# Patient Record
Sex: Female | Born: 1965
Health system: Southern US, Community
[De-identification: ages and names within clinical notes are randomized; demographics above are authoritative.]

## PROBLEM LIST (undated history)

## (undated) ENCOUNTER — Emergency Department (HOSPITAL_BASED_OUTPATIENT_CLINIC_OR_DEPARTMENT_OTHER): Admission: EM | Payer: BC Managed Care – PPO

## (undated) DIAGNOSIS — M2550 Pain in unspecified joint: Secondary | ICD-10-CM

## (undated) DIAGNOSIS — L309 Dermatitis, unspecified: Secondary | ICD-10-CM

## (undated) DIAGNOSIS — R0602 Shortness of breath: Secondary | ICD-10-CM

## (undated) DIAGNOSIS — R12 Heartburn: Secondary | ICD-10-CM

## (undated) DIAGNOSIS — E7800 Pure hypercholesterolemia, unspecified: Secondary | ICD-10-CM

## (undated) DIAGNOSIS — R7303 Prediabetes: Secondary | ICD-10-CM

## (undated) DIAGNOSIS — K760 Fatty (change of) liver, not elsewhere classified: Secondary | ICD-10-CM

## (undated) DIAGNOSIS — E039 Hypothyroidism, unspecified: Secondary | ICD-10-CM

## (undated) DIAGNOSIS — E559 Vitamin D deficiency, unspecified: Secondary | ICD-10-CM

## (undated) DIAGNOSIS — I10 Essential (primary) hypertension: Secondary | ICD-10-CM

## (undated) HISTORY — DX: Pure hypercholesterolemia, unspecified: E78.00

## (undated) HISTORY — DX: Vitamin D deficiency, unspecified: E55.9

## (undated) HISTORY — DX: Prediabetes: R73.03

## (undated) HISTORY — PX: TONSILLECTOMY: SUR1361

## (undated) HISTORY — DX: Heartburn: R12

## (undated) HISTORY — DX: Pain in unspecified joint: M25.50

## (undated) HISTORY — DX: Hypothyroidism, unspecified: E03.9

## (undated) HISTORY — PX: TEAR DUCT PROBING: SHX793

## (undated) HISTORY — DX: Fatty (change of) liver, not elsewhere classified: K76.0

## (undated) HISTORY — DX: Shortness of breath: R06.02

---

## 1999-04-17 ENCOUNTER — Other Ambulatory Visit: Admission: RE | Admit: 1999-04-17 | Discharge: 1999-04-17 | Payer: Self-pay | Admitting: Obstetrics & Gynecology

## 2001-12-02 ENCOUNTER — Other Ambulatory Visit: Admission: RE | Admit: 2001-12-02 | Discharge: 2001-12-02 | Payer: Self-pay | Admitting: Obstetrics & Gynecology

## 2003-01-12 ENCOUNTER — Other Ambulatory Visit: Admission: RE | Admit: 2003-01-12 | Discharge: 2003-01-12 | Payer: Self-pay | Admitting: Obstetrics & Gynecology

## 2004-06-12 ENCOUNTER — Other Ambulatory Visit: Admission: RE | Admit: 2004-06-12 | Discharge: 2004-06-12 | Payer: Self-pay | Admitting: Obstetrics & Gynecology

## 2005-08-26 ENCOUNTER — Other Ambulatory Visit: Admission: RE | Admit: 2005-08-26 | Discharge: 2005-08-26 | Payer: Self-pay | Admitting: Obstetrics & Gynecology

## 2013-12-12 ENCOUNTER — Ambulatory Visit
Admission: RE | Admit: 2013-12-12 | Discharge: 2013-12-12 | Disposition: A | Discharge status: Home / Self Care | Payer: 59 | Source: Ambulatory Visit | Attending: Family Medicine | Admitting: Family Medicine

## 2013-12-12 ENCOUNTER — Other Ambulatory Visit: Payer: Self-pay | Admitting: Family Medicine

## 2013-12-12 DIAGNOSIS — R109 Unspecified abdominal pain: Secondary | ICD-10-CM

## 2013-12-12 DIAGNOSIS — K625 Hemorrhage of anus and rectum: Secondary | ICD-10-CM

## 2013-12-12 MED ORDER — IOHEXOL 300 MG/ML  SOLN
100.0000 mL | Freq: Once | INTRAMUSCULAR | Status: AC | PRN
Start: 2013-12-12 — End: 2013-12-12
  Administered 2013-12-12: 100 mL via INTRAVENOUS

## 2015-03-25 IMAGING — CT CT ABD-PELV W/ CM
3 of 5 series · 13 of 36 positions shown, 19 images · IV contrast (OMNI 300/WATER & [ID] OMNI 300)
Comparison: None.

CLINICAL DATA: Abdominal pain with rectal bleeding.

EXAM:
CT ABDOMEN AND PELVIS WITH CONTRAST
TECHNIQUE: Multidetector CT imaging of the abdomen and pelvis was performed
using the standard protocol following bolus administration of
intravenous contrast.
CONTRAST:  100mL OMNIPAQUE IOHEXOL 300 MG/ML  SOLN

[Series 3: abd/pelvis with · axial · 0.90mm/px · z∈[-348,-18]mm · 7 of 88 slices shown, 12 images]
[im 11/88  soft-tissue]
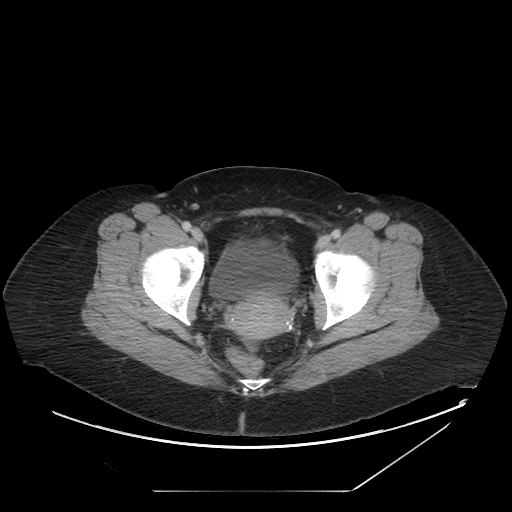
[im 11/88  bone]
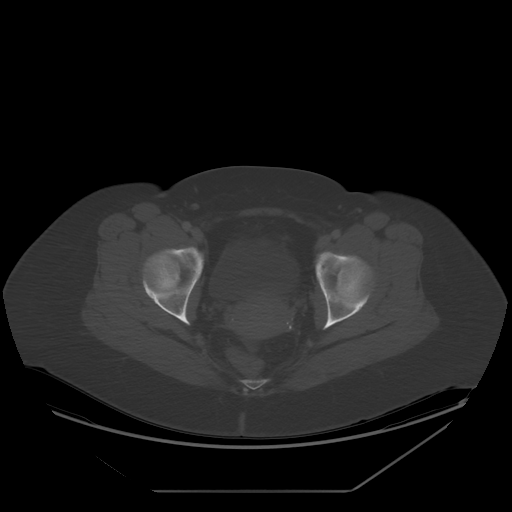
[im 22/88  soft-tissue]
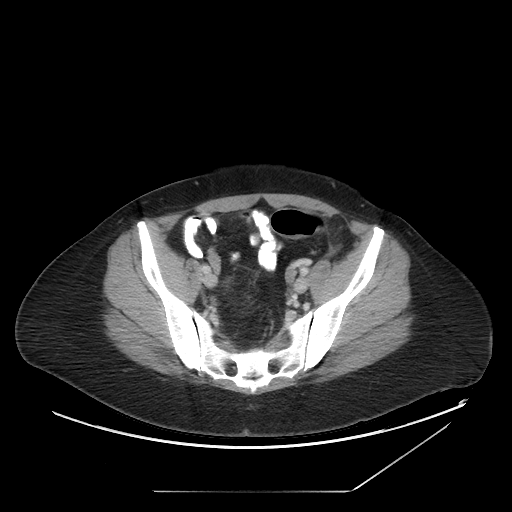
[im 33/88  soft-tissue]
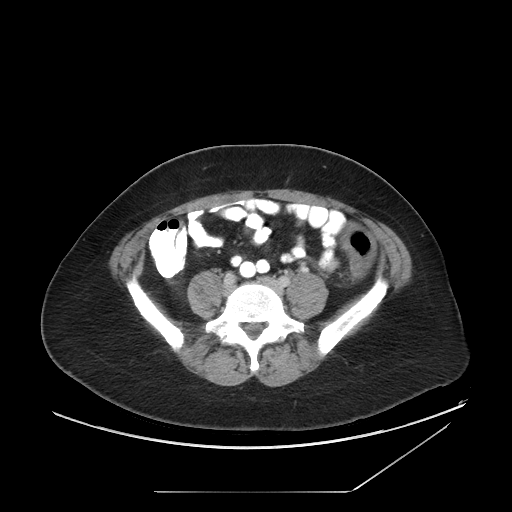
[im 44/88  soft-tissue]
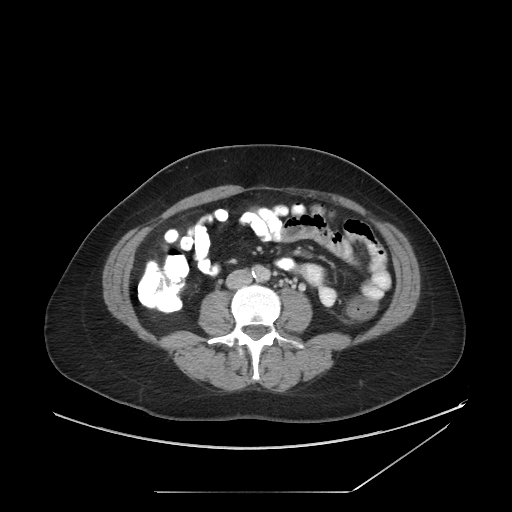
[im 44/88  lung]
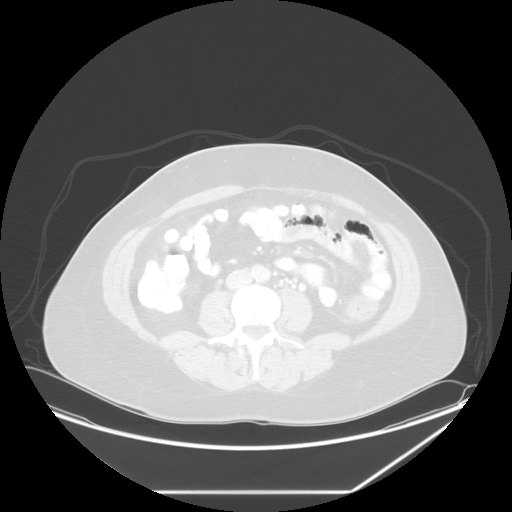
[im 55/88  soft-tissue]
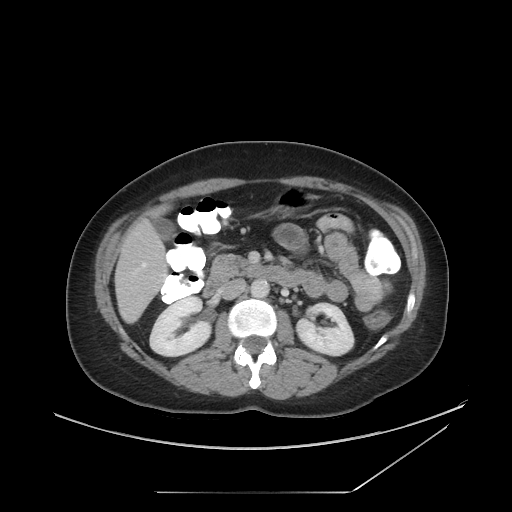
[im 55/88  lung]
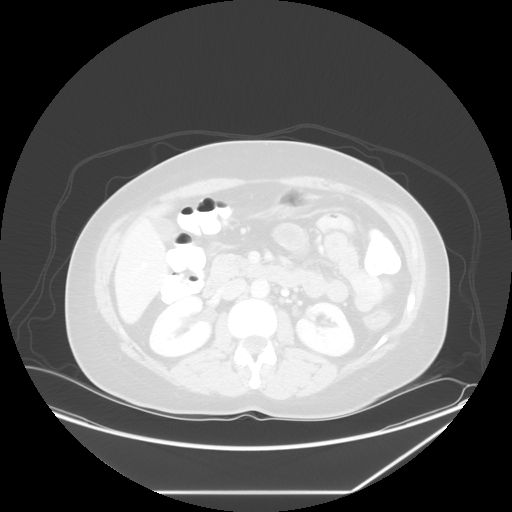
[im 66/88  soft-tissue]
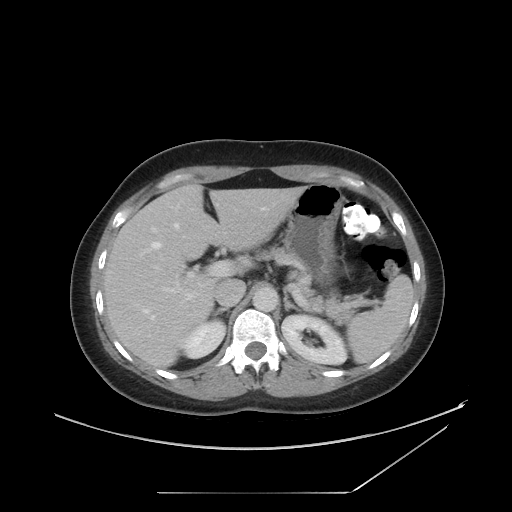
[im 66/88  lung]
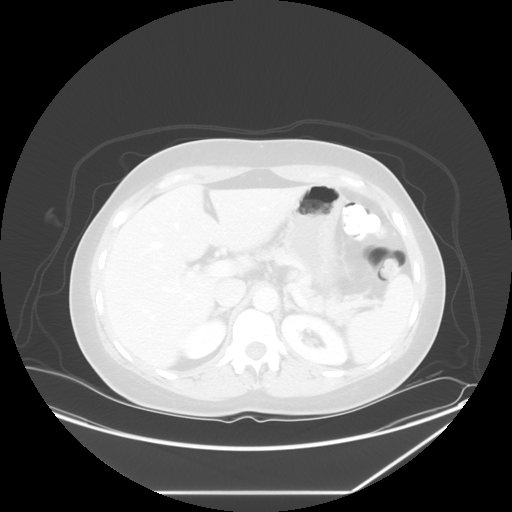
[im 77/88  soft-tissue]
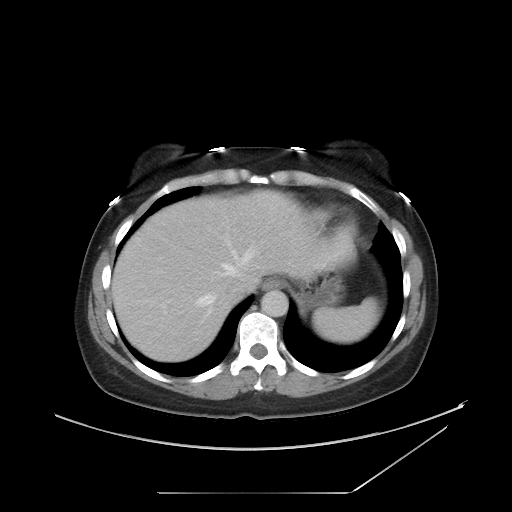
[im 77/88  lung]
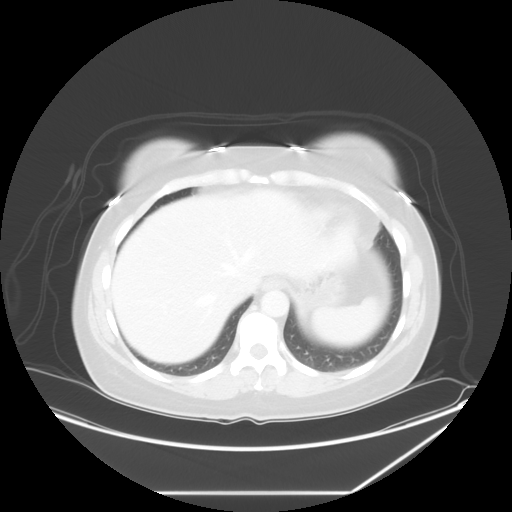

[Series 601: coronal body · coronal · 0.93mm/px · 1 of 122 slices shown, 2 images]
[im 41/122  soft-tissue]
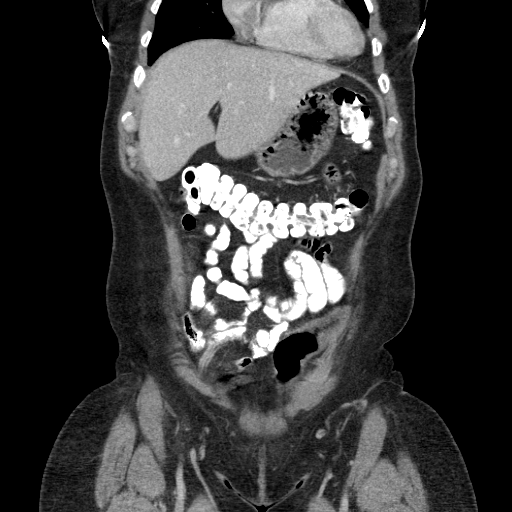
[im 41/122  bone]
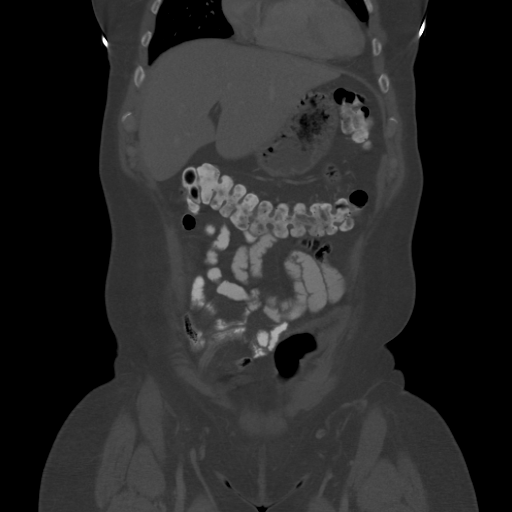

[Series 602: sagittal body · sagittal · 0.93mm/px · 5 of 184 slices shown]
[im 22/184  soft-tissue]
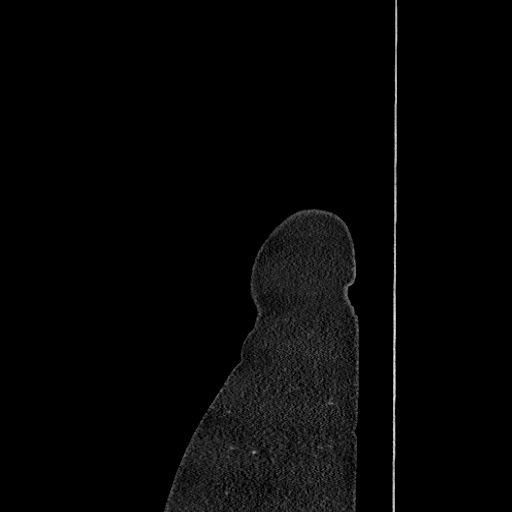
[im 44/184  soft-tissue]
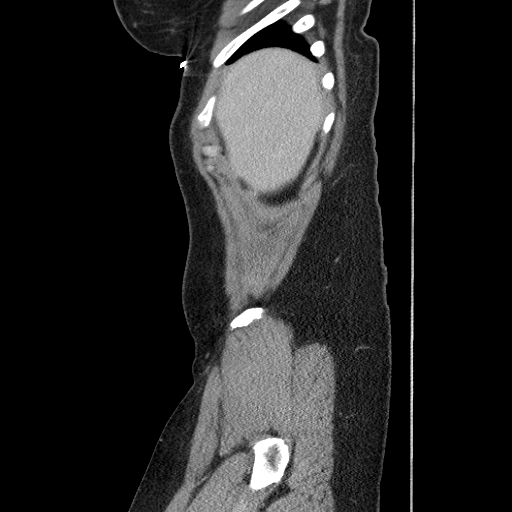
[im 65/184  soft-tissue]
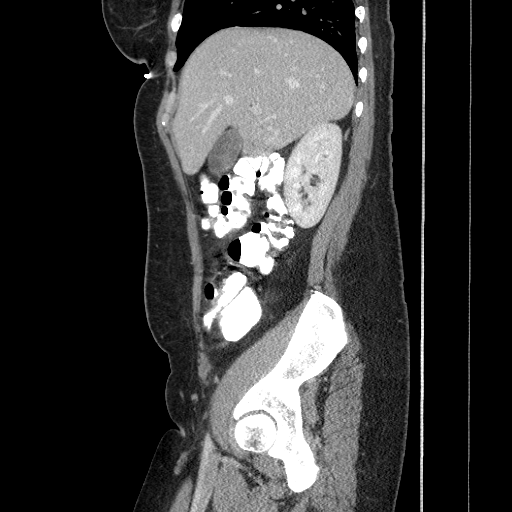
[im 87/184  soft-tissue]
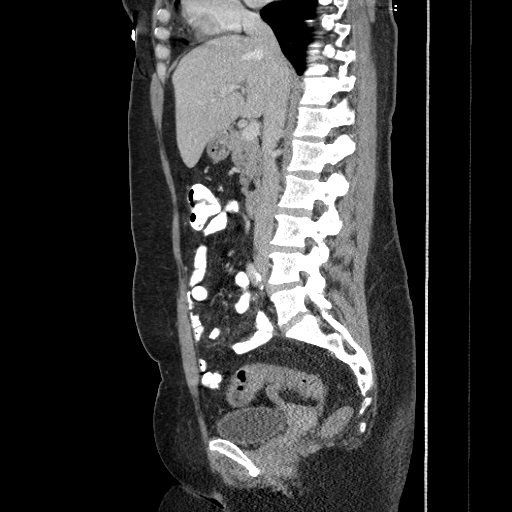
[im 108/184  soft-tissue]
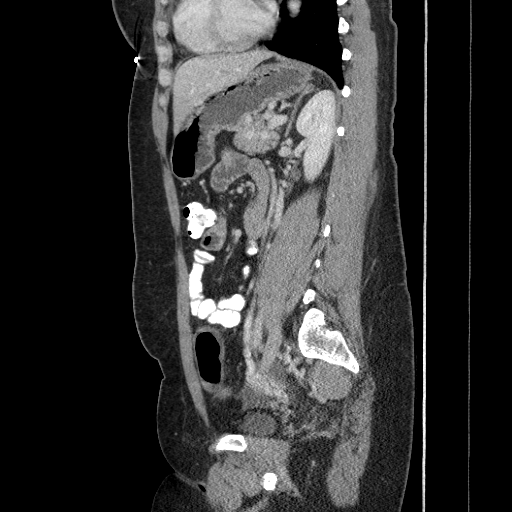

[13 of 36 positions shown; findings below may reference images not displayed]

FINDINGS: There is extensive edema of the mucosa of the descending and
proximal sigmoid portions of the colon consistent with colitis.
There is very slight pericolonic soft tissue stranding. There is no
free air in the abdomen. There is small amount of free fluid in the
pelvis.

The liver, biliary tree, spleen, pancreas, adrenal glands, and
kidneys appear normal. Small bowel and terminal ileum appear normal.
Appendix is not definitively identified.

Uterus and ovaries are normal. Bladder is normal. No osseous
abnormality.
IMPRESSION: Colitis involving the descending and proximal sigmoid portions of
the colon.

## 2017-03-03 DIAGNOSIS — M722 Plantar fascial fibromatosis: Secondary | ICD-10-CM | POA: Diagnosis not present

## 2017-03-18 DIAGNOSIS — B338 Other specified viral diseases: Secondary | ICD-10-CM | POA: Diagnosis not present

## 2017-03-31 DIAGNOSIS — M722 Plantar fascial fibromatosis: Secondary | ICD-10-CM | POA: Diagnosis not present

## 2017-04-20 DIAGNOSIS — Z1231 Encounter for screening mammogram for malignant neoplasm of breast: Secondary | ICD-10-CM | POA: Diagnosis not present

## 2017-04-20 DIAGNOSIS — Z01419 Encounter for gynecological examination (general) (routine) without abnormal findings: Secondary | ICD-10-CM | POA: Diagnosis not present

## 2017-04-27 DIAGNOSIS — I10 Essential (primary) hypertension: Secondary | ICD-10-CM | POA: Diagnosis not present

## 2017-04-27 DIAGNOSIS — Z0000 Encounter for general adult medical examination without abnormal findings: Secondary | ICD-10-CM | POA: Diagnosis not present

## 2017-04-29 DIAGNOSIS — R310 Gross hematuria: Secondary | ICD-10-CM | POA: Diagnosis not present

## 2017-05-14 ENCOUNTER — Ambulatory Visit (INDEPENDENT_AMBULATORY_CARE_PROVIDER_SITE_OTHER): Payer: 59 | Admitting: Podiatry

## 2017-05-14 ENCOUNTER — Encounter: Payer: Self-pay | Admitting: Podiatry

## 2017-05-14 ENCOUNTER — Ambulatory Visit: Payer: 59

## 2017-05-14 VITALS — BP 155/93 | HR 82

## 2017-05-14 DIAGNOSIS — M79672 Pain in left foot: Secondary | ICD-10-CM

## 2017-05-14 DIAGNOSIS — M722 Plantar fascial fibromatosis: Secondary | ICD-10-CM | POA: Diagnosis not present

## 2017-05-14 MED ORDER — MELOXICAM 15 MG PO TABS
15.0000 mg | ORAL_TABLET | Freq: Every day | ORAL | 2 refills | Status: AC
Start: 2017-05-14 — End: 2018-05-14

## 2017-05-14 NOTE — Progress Notes (Signed)
Subjective:    Patient ID: Tracy Reilly, female    DOB: 05-11-1966, 51 y.o.   MRN: [id number]  HPI Chief Complaint  Patient presents with  . Plantar Fasciitis    Lt foot medial side/bottom/back of the heel pain 6 months   51 year old female presents the office today for concerns of left heel pain which has been ongoing for about 6 months. She previously saw orthopedics for this issue steroid injection should help for short nighttime foot pain came back. She is also been stretching which does help some. She's has had a history of plantar fasciitis 15 years ago and that she had a heel cup that she used then she's been using that as well. She states the pain has gotten somewhat better over the last couple of months however does continue. She denies any recent injury or trauma. She denies any swelling or redness. No numbness or tingling. The pain does not wake her up at night. She states that she gets pain in the morning she first gets up of she's been sitting for some time and stands backup she starts to get pain. She does walk the pain is decreased. The pain started become more consistent however. She has no other concerns today.   Review of Systems  All other systems reviewed and are negative.      Objective:   Physical Exam General: AAO x3, NAD  Dermatological: Skin is warm, dry and supple bilateral. Nails x 10 are well manicured; remaining integument appears unremarkable at this time. There are no open sores, no preulcerative lesions, no rash or signs of infection present.  Vascular: Dorsalis Pedis artery and Posterior Tibial artery pedal pulses are 2/4 bilateral with immedate capillary fill time.There is no pain with calf compression, swelling, warmth, erythema.   Neruologic: Grossly intact via light touch bilateral. Vibratory intact via tuning fork bilateral. Protective threshold with Semmes Wienstein monofilament intact to all pedal sites bilateral. Negative tinel sign bilaterally.    Musculoskeletal: Tenderness to palpation along the plantar medial tubercle of the calcaneus at the insertion of plantar fascia on the left foot. There is no pain along the course of the plantar fascia within the arch of the foot. Plantar fascia appears to be intact. There is no pain with lateral compression of the calcaneus or pain with vibratory sensation. There is no pain along the course or insertion of the achilles tendon. No other areas of tenderness to bilateral lower extremities.Muscular strength 5/5 in all groups tested bilateral.  Gait: Unassisted, Nonantalgic.     Assessment & Plan:  51 year old female left heel pain likely result of plantar fasciitis; heel spur -Treatment options discussed including all alternatives, risks, and complications -Etiology of symptoms were discussed -I reviewed the x-rays and orthopedics that she brought in with her. There is no evidence of acute fracture that I can appreciate. There is both posterior as well as inferior calcaneal spurring present. -Patient elects to proceed with steroid injection into the left heel. Under sterile skin preparation, a total of 2.5cc of kenalog 10, 0.5% Marcaine plain, and 2% lidocaine plain were infiltrated into the symptomatic area without complication. A band-aid was applied. Patient tolerated the injection well without complication. Post-injection care with discussed with the patient. Discussed with the patient to ice the area over the next couple of days to help prevent a steroid flare.  -Plantar fascial brace was dispensed -Night splint. (aware that insurance will likely not cover both braces if they do cover them and  she is aware) -Prescribed mobic. Discussed side effects of the medication and directed to stop if any are to occur and call the office.  -Stretching, icing exercises daily -Discussed supportive shoe gear as well as continue inserts. May need a more custom insert. -RTC 3 week or sooner if needed.  Celesta Gentile, DPM

## 2017-05-14 NOTE — Patient Instructions (Signed)

## 2017-05-16 DIAGNOSIS — M722 Plantar fascial fibromatosis: Secondary | ICD-10-CM | POA: Insufficient documentation

## 2017-06-03 ENCOUNTER — Encounter: Payer: Self-pay | Admitting: Podiatry

## 2017-06-03 ENCOUNTER — Ambulatory Visit (INDEPENDENT_AMBULATORY_CARE_PROVIDER_SITE_OTHER): Payer: 59 | Admitting: Podiatry

## 2017-06-03 DIAGNOSIS — M722 Plantar fascial fibromatosis: Secondary | ICD-10-CM | POA: Diagnosis not present

## 2017-06-03 NOTE — Progress Notes (Signed)
Subjective: Tracy Reilly presents to the office today for follow-up evaluation of left heel pain. They state that they are doing better but still having some pain. They have been icing, stretching, try to wear supportive shoe as much as possible. She states the plantar fascial brace and the night splint have been helping. She continues with mobic which also helps. No other complaints at this time. No acute changes since last appointment. They deny any systemic complaints such as fevers, chills, nausea, vomiting.  Objective: General: AAO x3, NAD  Dermatological: Skin is warm, dry and supple bilateral. Nails x 10 are well manicured; remaining integument appears unremarkable at this time. There are no open sores, no preulcerative lesions, no rash or signs of infection present.  Vascular: Dorsalis Pedis artery and Posterior Tibial artery pedal pulses are 2/4 bilateral with immedate capillary fill time. Pedal hair growth present. There is no pain with calf compression, swelling, warmth, erythema.   Neruologic: Grossly intact via light touch bilateral. Vibratory intact via tuning fork bilateral. Protective threshold with Semmes Wienstein monofilament intact to all pedal sites bilateral.   Musculoskeletal: There is improved but continued tenderness palpation along the plantar medial tubercle of the calcaneus at the insertion of the plantar fascia on the left foot. There is no pain along the course of the plantar fascia within the arch of the foot. Plantar fascia appears to be intact bilaterally. There is no pain with lateral compression of the calcaneus and there is no pain with vibratory sensation. There is no pain along the course or insertion of the Achilles tendon. There are no other areas of tenderness to bilateral lower extremities. No gross boney pedal deformities bilateral. No pain, crepitus, or limitation noted with foot and ankle range of motion bilateral. Muscular strength 5/5 in all groups tested  bilateral.  Gait: Unassisted, Nonantalgic.   Assessment: Presents for follow-up evaluation for heel pain, likely plantar fasciitis   Plan: -Treatment options discussed including all alternatives, risks, and complications -Patient elects to proceed with steroid injection into the left heel. Under sterile skin preparation, a total of 2.5cc of kenalog 10, 0.5% Marcaine plain, and 2% lidocaine plain were infiltrated into the symptomatic area without complication. A band-aid was applied. Patient tolerated the injection well without complication. Post-injection care with discussed with the patient. Discussed with the patient to ice the area over the next couple of days to help prevent a steroid flare.  -Plantar fascial tapings applied -Meloxicam as needed -Ice and stretching exercises on a daily basis. -Continue supportive shoe gear. She has over-the-counter inserts and recommended continue this. -Follow-up in 3-4 weeks or sooner if any problems arise. In the meantime, encouraged to call the office with any questions, concerns, change in symptoms.   Celesta Gentile, DPM

## 2017-06-25 ENCOUNTER — Ambulatory Visit: Payer: 59 | Admitting: Podiatry

## 2017-08-24 DIAGNOSIS — Z23 Encounter for immunization: Secondary | ICD-10-CM | POA: Diagnosis not present

## 2017-10-21 DIAGNOSIS — J029 Acute pharyngitis, unspecified: Secondary | ICD-10-CM | POA: Diagnosis not present

## 2017-10-21 DIAGNOSIS — J069 Acute upper respiratory infection, unspecified: Secondary | ICD-10-CM | POA: Diagnosis not present

## 2017-11-03 DIAGNOSIS — I10 Essential (primary) hypertension: Secondary | ICD-10-CM | POA: Diagnosis not present

## 2017-11-03 DIAGNOSIS — L259 Unspecified contact dermatitis, unspecified cause: Secondary | ICD-10-CM | POA: Diagnosis not present

## 2017-11-15 DIAGNOSIS — D224 Melanocytic nevi of scalp and neck: Secondary | ICD-10-CM | POA: Diagnosis not present

## 2017-11-15 DIAGNOSIS — L309 Dermatitis, unspecified: Secondary | ICD-10-CM | POA: Diagnosis not present

## 2017-11-15 DIAGNOSIS — L573 Poikiloderma of Civatte: Secondary | ICD-10-CM | POA: Diagnosis not present

## 2018-01-06 DIAGNOSIS — L309 Dermatitis, unspecified: Secondary | ICD-10-CM | POA: Diagnosis not present

## 2018-01-06 DIAGNOSIS — L500 Allergic urticaria: Secondary | ICD-10-CM | POA: Diagnosis not present

## 2018-01-19 DIAGNOSIS — L500 Allergic urticaria: Secondary | ICD-10-CM | POA: Diagnosis not present

## 2018-03-14 DIAGNOSIS — I10 Essential (primary) hypertension: Secondary | ICD-10-CM | POA: Diagnosis not present

## 2018-04-04 DIAGNOSIS — R109 Unspecified abdominal pain: Secondary | ICD-10-CM | POA: Diagnosis not present

## 2018-04-06 DIAGNOSIS — R109 Unspecified abdominal pain: Secondary | ICD-10-CM | POA: Diagnosis not present

## 2018-04-26 DIAGNOSIS — Z6833 Body mass index (BMI) 33.0-33.9, adult: Secondary | ICD-10-CM | POA: Diagnosis not present

## 2018-04-26 DIAGNOSIS — Z01419 Encounter for gynecological examination (general) (routine) without abnormal findings: Secondary | ICD-10-CM | POA: Diagnosis not present

## 2018-05-04 DIAGNOSIS — I10 Essential (primary) hypertension: Secondary | ICD-10-CM | POA: Diagnosis not present

## 2018-05-04 DIAGNOSIS — Z0000 Encounter for general adult medical examination without abnormal findings: Secondary | ICD-10-CM | POA: Diagnosis not present

## 2018-05-18 DIAGNOSIS — H04123 Dry eye syndrome of bilateral lacrimal glands: Secondary | ICD-10-CM | POA: Diagnosis not present

## 2018-08-02 DIAGNOSIS — L309 Dermatitis, unspecified: Secondary | ICD-10-CM | POA: Diagnosis not present

## 2018-08-02 DIAGNOSIS — L920 Granuloma annulare: Secondary | ICD-10-CM | POA: Diagnosis not present

## 2018-08-02 DIAGNOSIS — L503 Dermatographic urticaria: Secondary | ICD-10-CM | POA: Diagnosis not present

## 2018-08-11 DIAGNOSIS — L309 Dermatitis, unspecified: Secondary | ICD-10-CM | POA: Diagnosis not present

## 2018-08-11 DIAGNOSIS — Z4802 Encounter for removal of sutures: Secondary | ICD-10-CM | POA: Diagnosis not present

## 2018-08-23 DIAGNOSIS — Z23 Encounter for immunization: Secondary | ICD-10-CM | POA: Diagnosis not present

## 2018-08-31 DIAGNOSIS — I10 Essential (primary) hypertension: Secondary | ICD-10-CM | POA: Diagnosis not present

## 2018-09-13 DIAGNOSIS — L308 Other specified dermatitis: Secondary | ICD-10-CM | POA: Diagnosis not present

## 2018-09-13 DIAGNOSIS — L309 Dermatitis, unspecified: Secondary | ICD-10-CM | POA: Diagnosis not present

## 2018-09-21 DIAGNOSIS — I10 Essential (primary) hypertension: Secondary | ICD-10-CM | POA: Diagnosis not present

## 2018-12-26 DIAGNOSIS — Z6833 Body mass index (BMI) 33.0-33.9, adult: Secondary | ICD-10-CM | POA: Diagnosis not present

## 2018-12-26 DIAGNOSIS — L309 Dermatitis, unspecified: Secondary | ICD-10-CM | POA: Diagnosis not present

## 2019-01-11 DIAGNOSIS — L309 Dermatitis, unspecified: Secondary | ICD-10-CM | POA: Diagnosis not present

## 2019-01-11 DIAGNOSIS — Z5181 Encounter for therapeutic drug level monitoring: Secondary | ICD-10-CM | POA: Diagnosis not present

## 2019-02-28 DIAGNOSIS — I10 Essential (primary) hypertension: Secondary | ICD-10-CM | POA: Diagnosis not present

## 2019-11-15 DIAGNOSIS — E559 Vitamin D deficiency, unspecified: Secondary | ICD-10-CM | POA: Diagnosis not present

## 2019-11-15 DIAGNOSIS — I10 Essential (primary) hypertension: Secondary | ICD-10-CM | POA: Diagnosis not present

## 2019-11-15 DIAGNOSIS — E781 Pure hyperglyceridemia: Secondary | ICD-10-CM | POA: Diagnosis not present

## 2019-11-15 DIAGNOSIS — L309 Dermatitis, unspecified: Secondary | ICD-10-CM | POA: Diagnosis not present

## 2019-11-27 DIAGNOSIS — E559 Vitamin D deficiency, unspecified: Secondary | ICD-10-CM | POA: Diagnosis not present

## 2019-11-27 DIAGNOSIS — E781 Pure hyperglyceridemia: Secondary | ICD-10-CM | POA: Diagnosis not present

## 2019-11-27 DIAGNOSIS — I10 Essential (primary) hypertension: Secondary | ICD-10-CM | POA: Diagnosis not present

## 2020-02-22 DIAGNOSIS — H524 Presbyopia: Secondary | ICD-10-CM | POA: Diagnosis not present

## 2020-02-22 DIAGNOSIS — H10413 Chronic giant papillary conjunctivitis, bilateral: Secondary | ICD-10-CM | POA: Diagnosis not present

## 2020-02-22 DIAGNOSIS — H52203 Unspecified astigmatism, bilateral: Secondary | ICD-10-CM | POA: Diagnosis not present

## 2020-02-22 DIAGNOSIS — H04123 Dry eye syndrome of bilateral lacrimal glands: Secondary | ICD-10-CM | POA: Diagnosis not present

## 2020-02-28 DIAGNOSIS — Z79899 Other long term (current) drug therapy: Secondary | ICD-10-CM | POA: Diagnosis not present

## 2020-02-28 DIAGNOSIS — L2084 Intrinsic (allergic) eczema: Secondary | ICD-10-CM | POA: Diagnosis not present

## 2020-03-11 DIAGNOSIS — M25511 Pain in right shoulder: Secondary | ICD-10-CM | POA: Diagnosis not present

## 2020-03-11 DIAGNOSIS — M25512 Pain in left shoulder: Secondary | ICD-10-CM | POA: Diagnosis not present

## 2020-03-20 DIAGNOSIS — Z20828 Contact with and (suspected) exposure to other viral communicable diseases: Secondary | ICD-10-CM | POA: Diagnosis not present

## 2020-03-26 DIAGNOSIS — J209 Acute bronchitis, unspecified: Secondary | ICD-10-CM | POA: Diagnosis not present

## 2020-04-11 DIAGNOSIS — J302 Other seasonal allergic rhinitis: Secondary | ICD-10-CM | POA: Diagnosis not present

## 2020-04-11 DIAGNOSIS — J019 Acute sinusitis, unspecified: Secondary | ICD-10-CM | POA: Diagnosis not present

## 2020-05-15 DIAGNOSIS — R0981 Nasal congestion: Secondary | ICD-10-CM | POA: Diagnosis not present

## 2020-05-15 DIAGNOSIS — R05 Cough: Secondary | ICD-10-CM | POA: Diagnosis not present

## 2020-05-15 DIAGNOSIS — R0602 Shortness of breath: Secondary | ICD-10-CM | POA: Diagnosis not present

## 2020-05-15 DIAGNOSIS — R062 Wheezing: Secondary | ICD-10-CM | POA: Diagnosis not present

## 2020-06-25 DIAGNOSIS — L209 Atopic dermatitis, unspecified: Secondary | ICD-10-CM | POA: Diagnosis not present

## 2020-06-25 DIAGNOSIS — R05 Cough: Secondary | ICD-10-CM | POA: Diagnosis not present

## 2020-06-25 DIAGNOSIS — R21 Rash and other nonspecific skin eruption: Secondary | ICD-10-CM | POA: Diagnosis not present

## 2020-06-25 DIAGNOSIS — J3089 Other allergic rhinitis: Secondary | ICD-10-CM | POA: Diagnosis not present

## 2020-06-27 DIAGNOSIS — Z01419 Encounter for gynecological examination (general) (routine) without abnormal findings: Secondary | ICD-10-CM | POA: Diagnosis not present

## 2020-06-27 DIAGNOSIS — Z1231 Encounter for screening mammogram for malignant neoplasm of breast: Secondary | ICD-10-CM | POA: Diagnosis not present

## 2020-06-27 DIAGNOSIS — Z6833 Body mass index (BMI) 33.0-33.9, adult: Secondary | ICD-10-CM | POA: Diagnosis not present

## 2020-07-28 DIAGNOSIS — Z20822 Contact with and (suspected) exposure to covid-19: Secondary | ICD-10-CM | POA: Diagnosis not present

## 2020-08-08 DIAGNOSIS — N951 Menopausal and female climacteric states: Secondary | ICD-10-CM | POA: Diagnosis not present

## 2020-08-08 DIAGNOSIS — Z13820 Encounter for screening for osteoporosis: Secondary | ICD-10-CM | POA: Diagnosis not present

## 2020-08-20 DIAGNOSIS — Z23 Encounter for immunization: Secondary | ICD-10-CM | POA: Diagnosis not present

## 2020-08-26 DIAGNOSIS — N3000 Acute cystitis without hematuria: Secondary | ICD-10-CM | POA: Diagnosis not present

## 2020-08-26 DIAGNOSIS — R3915 Urgency of urination: Secondary | ICD-10-CM | POA: Diagnosis not present

## 2020-08-26 DIAGNOSIS — M25512 Pain in left shoulder: Secondary | ICD-10-CM | POA: Diagnosis not present

## 2020-08-26 DIAGNOSIS — Z780 Asymptomatic menopausal state: Secondary | ICD-10-CM | POA: Diagnosis not present

## 2020-08-26 DIAGNOSIS — M25511 Pain in right shoulder: Secondary | ICD-10-CM | POA: Diagnosis not present

## 2020-09-02 DIAGNOSIS — M25512 Pain in left shoulder: Secondary | ICD-10-CM | POA: Diagnosis not present

## 2020-09-04 DIAGNOSIS — M67912 Unspecified disorder of synovium and tendon, left shoulder: Secondary | ICD-10-CM | POA: Diagnosis not present

## 2020-09-04 DIAGNOSIS — M67911 Unspecified disorder of synovium and tendon, right shoulder: Secondary | ICD-10-CM | POA: Diagnosis not present

## 2020-09-12 DIAGNOSIS — S46012D Strain of muscle(s) and tendon(s) of the rotator cuff of left shoulder, subsequent encounter: Secondary | ICD-10-CM | POA: Diagnosis not present

## 2020-09-12 DIAGNOSIS — S46011D Strain of muscle(s) and tendon(s) of the rotator cuff of right shoulder, subsequent encounter: Secondary | ICD-10-CM | POA: Diagnosis not present

## 2020-09-16 DIAGNOSIS — S46011D Strain of muscle(s) and tendon(s) of the rotator cuff of right shoulder, subsequent encounter: Secondary | ICD-10-CM | POA: Diagnosis not present

## 2020-09-16 DIAGNOSIS — S46012D Strain of muscle(s) and tendon(s) of the rotator cuff of left shoulder, subsequent encounter: Secondary | ICD-10-CM | POA: Diagnosis not present

## 2020-09-23 DIAGNOSIS — S46012D Strain of muscle(s) and tendon(s) of the rotator cuff of left shoulder, subsequent encounter: Secondary | ICD-10-CM | POA: Diagnosis not present

## 2020-09-23 DIAGNOSIS — S46011D Strain of muscle(s) and tendon(s) of the rotator cuff of right shoulder, subsequent encounter: Secondary | ICD-10-CM | POA: Diagnosis not present

## 2020-10-01 DIAGNOSIS — S46011D Strain of muscle(s) and tendon(s) of the rotator cuff of right shoulder, subsequent encounter: Secondary | ICD-10-CM | POA: Diagnosis not present

## 2020-10-01 DIAGNOSIS — S46012D Strain of muscle(s) and tendon(s) of the rotator cuff of left shoulder, subsequent encounter: Secondary | ICD-10-CM | POA: Diagnosis not present

## 2020-10-03 DIAGNOSIS — S46012D Strain of muscle(s) and tendon(s) of the rotator cuff of left shoulder, subsequent encounter: Secondary | ICD-10-CM | POA: Diagnosis not present

## 2020-10-03 DIAGNOSIS — S46011D Strain of muscle(s) and tendon(s) of the rotator cuff of right shoulder, subsequent encounter: Secondary | ICD-10-CM | POA: Diagnosis not present

## 2020-10-07 DIAGNOSIS — S46011D Strain of muscle(s) and tendon(s) of the rotator cuff of right shoulder, subsequent encounter: Secondary | ICD-10-CM | POA: Diagnosis not present

## 2020-10-07 DIAGNOSIS — S46012D Strain of muscle(s) and tendon(s) of the rotator cuff of left shoulder, subsequent encounter: Secondary | ICD-10-CM | POA: Diagnosis not present

## 2020-10-10 DIAGNOSIS — S46011D Strain of muscle(s) and tendon(s) of the rotator cuff of right shoulder, subsequent encounter: Secondary | ICD-10-CM | POA: Diagnosis not present

## 2020-10-10 DIAGNOSIS — S46012D Strain of muscle(s) and tendon(s) of the rotator cuff of left shoulder, subsequent encounter: Secondary | ICD-10-CM | POA: Diagnosis not present

## 2020-10-16 DIAGNOSIS — M25512 Pain in left shoulder: Secondary | ICD-10-CM | POA: Diagnosis not present

## 2020-10-22 DIAGNOSIS — S46011D Strain of muscle(s) and tendon(s) of the rotator cuff of right shoulder, subsequent encounter: Secondary | ICD-10-CM | POA: Diagnosis not present

## 2020-10-22 DIAGNOSIS — S46012D Strain of muscle(s) and tendon(s) of the rotator cuff of left shoulder, subsequent encounter: Secondary | ICD-10-CM | POA: Diagnosis not present

## 2020-10-24 DIAGNOSIS — S46011D Strain of muscle(s) and tendon(s) of the rotator cuff of right shoulder, subsequent encounter: Secondary | ICD-10-CM | POA: Diagnosis not present

## 2020-10-24 DIAGNOSIS — S46012D Strain of muscle(s) and tendon(s) of the rotator cuff of left shoulder, subsequent encounter: Secondary | ICD-10-CM | POA: Diagnosis not present

## 2020-10-28 DIAGNOSIS — S46012D Strain of muscle(s) and tendon(s) of the rotator cuff of left shoulder, subsequent encounter: Secondary | ICD-10-CM | POA: Diagnosis not present

## 2020-10-28 DIAGNOSIS — S46011D Strain of muscle(s) and tendon(s) of the rotator cuff of right shoulder, subsequent encounter: Secondary | ICD-10-CM | POA: Diagnosis not present

## 2020-10-30 DIAGNOSIS — S46012D Strain of muscle(s) and tendon(s) of the rotator cuff of left shoulder, subsequent encounter: Secondary | ICD-10-CM | POA: Diagnosis not present

## 2020-10-30 DIAGNOSIS — S46011D Strain of muscle(s) and tendon(s) of the rotator cuff of right shoulder, subsequent encounter: Secondary | ICD-10-CM | POA: Diagnosis not present

## 2020-11-04 DIAGNOSIS — S46011D Strain of muscle(s) and tendon(s) of the rotator cuff of right shoulder, subsequent encounter: Secondary | ICD-10-CM | POA: Diagnosis not present

## 2020-11-04 DIAGNOSIS — S46012D Strain of muscle(s) and tendon(s) of the rotator cuff of left shoulder, subsequent encounter: Secondary | ICD-10-CM | POA: Diagnosis not present

## 2020-11-11 DIAGNOSIS — S46012D Strain of muscle(s) and tendon(s) of the rotator cuff of left shoulder, subsequent encounter: Secondary | ICD-10-CM | POA: Diagnosis not present

## 2020-11-11 DIAGNOSIS — S46011D Strain of muscle(s) and tendon(s) of the rotator cuff of right shoulder, subsequent encounter: Secondary | ICD-10-CM | POA: Diagnosis not present

## 2020-11-12 DIAGNOSIS — E559 Vitamin D deficiency, unspecified: Secondary | ICD-10-CM | POA: Diagnosis not present

## 2020-11-12 DIAGNOSIS — Z23 Encounter for immunization: Secondary | ICD-10-CM | POA: Diagnosis not present

## 2020-11-12 DIAGNOSIS — R350 Frequency of micturition: Secondary | ICD-10-CM | POA: Diagnosis not present

## 2020-11-12 DIAGNOSIS — I10 Essential (primary) hypertension: Secondary | ICD-10-CM | POA: Diagnosis not present

## 2020-11-12 DIAGNOSIS — L309 Dermatitis, unspecified: Secondary | ICD-10-CM | POA: Diagnosis not present

## 2020-11-12 DIAGNOSIS — R5383 Other fatigue: Secondary | ICD-10-CM | POA: Diagnosis not present

## 2020-11-14 DIAGNOSIS — S46012D Strain of muscle(s) and tendon(s) of the rotator cuff of left shoulder, subsequent encounter: Secondary | ICD-10-CM | POA: Diagnosis not present

## 2020-11-14 DIAGNOSIS — S46011D Strain of muscle(s) and tendon(s) of the rotator cuff of right shoulder, subsequent encounter: Secondary | ICD-10-CM | POA: Diagnosis not present

## 2020-11-25 DIAGNOSIS — S46011D Strain of muscle(s) and tendon(s) of the rotator cuff of right shoulder, subsequent encounter: Secondary | ICD-10-CM | POA: Diagnosis not present

## 2020-11-25 DIAGNOSIS — S46012D Strain of muscle(s) and tendon(s) of the rotator cuff of left shoulder, subsequent encounter: Secondary | ICD-10-CM | POA: Diagnosis not present

## 2020-11-29 DIAGNOSIS — S46012D Strain of muscle(s) and tendon(s) of the rotator cuff of left shoulder, subsequent encounter: Secondary | ICD-10-CM | POA: Diagnosis not present

## 2020-11-29 DIAGNOSIS — S46011D Strain of muscle(s) and tendon(s) of the rotator cuff of right shoulder, subsequent encounter: Secondary | ICD-10-CM | POA: Diagnosis not present

## 2020-12-18 DIAGNOSIS — K625 Hemorrhage of anus and rectum: Secondary | ICD-10-CM | POA: Diagnosis not present

## 2020-12-18 DIAGNOSIS — K529 Noninfective gastroenteritis and colitis, unspecified: Secondary | ICD-10-CM | POA: Diagnosis not present

## 2021-01-09 DIAGNOSIS — R1030 Lower abdominal pain, unspecified: Secondary | ICD-10-CM | POA: Diagnosis not present

## 2021-01-09 DIAGNOSIS — K625 Hemorrhage of anus and rectum: Secondary | ICD-10-CM | POA: Diagnosis not present

## 2021-02-28 DIAGNOSIS — D225 Melanocytic nevi of trunk: Secondary | ICD-10-CM | POA: Diagnosis not present

## 2021-02-28 DIAGNOSIS — Z79899 Other long term (current) drug therapy: Secondary | ICD-10-CM | POA: Diagnosis not present

## 2021-02-28 DIAGNOSIS — D1801 Hemangioma of skin and subcutaneous tissue: Secondary | ICD-10-CM | POA: Diagnosis not present

## 2021-02-28 DIAGNOSIS — L578 Other skin changes due to chronic exposure to nonionizing radiation: Secondary | ICD-10-CM | POA: Diagnosis not present

## 2021-02-28 DIAGNOSIS — D485 Neoplasm of uncertain behavior of skin: Secondary | ICD-10-CM | POA: Diagnosis not present

## 2021-02-28 DIAGNOSIS — L2084 Intrinsic (allergic) eczema: Secondary | ICD-10-CM | POA: Diagnosis not present

## 2021-02-28 DIAGNOSIS — L820 Inflamed seborrheic keratosis: Secondary | ICD-10-CM | POA: Diagnosis not present

## 2021-03-04 DIAGNOSIS — H524 Presbyopia: Secondary | ICD-10-CM | POA: Diagnosis not present

## 2021-03-04 DIAGNOSIS — H04123 Dry eye syndrome of bilateral lacrimal glands: Secondary | ICD-10-CM | POA: Diagnosis not present

## 2021-03-24 DIAGNOSIS — J019 Acute sinusitis, unspecified: Secondary | ICD-10-CM | POA: Diagnosis not present

## 2021-04-01 DIAGNOSIS — K5730 Diverticulosis of large intestine without perforation or abscess without bleeding: Secondary | ICD-10-CM | POA: Diagnosis not present

## 2021-04-01 DIAGNOSIS — D122 Benign neoplasm of ascending colon: Secondary | ICD-10-CM | POA: Diagnosis not present

## 2021-04-01 DIAGNOSIS — K635 Polyp of colon: Secondary | ICD-10-CM | POA: Diagnosis not present

## 2021-04-01 DIAGNOSIS — K625 Hemorrhage of anus and rectum: Secondary | ICD-10-CM | POA: Diagnosis not present

## 2021-06-10 DIAGNOSIS — D485 Neoplasm of uncertain behavior of skin: Secondary | ICD-10-CM | POA: Diagnosis not present

## 2021-06-10 DIAGNOSIS — L988 Other specified disorders of the skin and subcutaneous tissue: Secondary | ICD-10-CM | POA: Diagnosis not present

## 2021-06-30 DIAGNOSIS — Z20822 Contact with and (suspected) exposure to covid-19: Secondary | ICD-10-CM | POA: Diagnosis not present

## 2021-06-30 DIAGNOSIS — R5381 Other malaise: Secondary | ICD-10-CM | POA: Diagnosis not present

## 2021-07-04 DIAGNOSIS — J0100 Acute maxillary sinusitis, unspecified: Secondary | ICD-10-CM | POA: Diagnosis not present

## 2021-07-28 DIAGNOSIS — N950 Postmenopausal bleeding: Secondary | ICD-10-CM | POA: Diagnosis not present

## 2021-08-04 DIAGNOSIS — D485 Neoplasm of uncertain behavior of skin: Secondary | ICD-10-CM | POA: Diagnosis not present

## 2021-08-27 DIAGNOSIS — Z23 Encounter for immunization: Secondary | ICD-10-CM | POA: Diagnosis not present

## 2021-08-27 DIAGNOSIS — L2084 Intrinsic (allergic) eczema: Secondary | ICD-10-CM | POA: Diagnosis not present

## 2021-08-27 DIAGNOSIS — Z79899 Other long term (current) drug therapy: Secondary | ICD-10-CM | POA: Diagnosis not present

## 2021-08-29 ENCOUNTER — Other Ambulatory Visit: Payer: Self-pay | Admitting: Gastroenterology

## 2021-08-29 DIAGNOSIS — K5792 Diverticulitis of intestine, part unspecified, without perforation or abscess without bleeding: Secondary | ICD-10-CM | POA: Diagnosis not present

## 2021-08-29 DIAGNOSIS — R748 Abnormal levels of other serum enzymes: Secondary | ICD-10-CM | POA: Diagnosis not present

## 2021-08-29 DIAGNOSIS — K625 Hemorrhage of anus and rectum: Secondary | ICD-10-CM | POA: Diagnosis not present

## 2021-08-29 DIAGNOSIS — R195 Other fecal abnormalities: Secondary | ICD-10-CM | POA: Diagnosis not present

## 2021-08-30 ENCOUNTER — Other Ambulatory Visit: Payer: Self-pay | Admitting: Gastroenterology

## 2021-09-01 ENCOUNTER — Ambulatory Visit
Admission: RE | Admit: 2021-09-01 | Discharge: 2021-09-01 | Disposition: A | Discharge status: Home / Self Care | Payer: BC Managed Care – PPO | Source: Ambulatory Visit | Attending: Gastroenterology | Admitting: Gastroenterology

## 2021-09-01 ENCOUNTER — Other Ambulatory Visit: Payer: Self-pay | Admitting: Gastroenterology

## 2021-09-01 ENCOUNTER — Other Ambulatory Visit: Payer: Self-pay

## 2021-09-01 DIAGNOSIS — K5792 Diverticulitis of intestine, part unspecified, without perforation or abscess without bleeding: Secondary | ICD-10-CM

## 2021-09-01 DIAGNOSIS — I700 Atherosclerosis of aorta: Secondary | ICD-10-CM | POA: Diagnosis not present

## 2021-09-01 MED ORDER — IOPAMIDOL (ISOVUE-300) INJECTION 61%
100.0000 mL | Freq: Once | INTRAVENOUS | Status: AC | PRN
  Administered 2021-09-01: 100 mL via INTRAVENOUS

## 2021-09-05 DIAGNOSIS — L739 Follicular disorder, unspecified: Secondary | ICD-10-CM | POA: Diagnosis not present

## 2021-09-05 DIAGNOSIS — D485 Neoplasm of uncertain behavior of skin: Secondary | ICD-10-CM | POA: Diagnosis not present

## 2021-09-05 DIAGNOSIS — L723 Sebaceous cyst: Secondary | ICD-10-CM | POA: Diagnosis not present

## 2021-09-05 DIAGNOSIS — H04223 Epiphora due to insufficient drainage, bilateral lacrimal glands: Secondary | ICD-10-CM | POA: Diagnosis not present

## 2021-09-05 DIAGNOSIS — H04541 Stenosis of right lacrimal canaliculi: Secondary | ICD-10-CM | POA: Diagnosis not present

## 2021-09-05 DIAGNOSIS — H04561 Stenosis of right lacrimal punctum: Secondary | ICD-10-CM | POA: Diagnosis not present

## 2021-09-11 ENCOUNTER — Other Ambulatory Visit: Payer: Self-pay | Admitting: Gastroenterology

## 2021-09-11 DIAGNOSIS — R7989 Other specified abnormal findings of blood chemistry: Secondary | ICD-10-CM

## 2021-09-15 DIAGNOSIS — Z1231 Encounter for screening mammogram for malignant neoplasm of breast: Secondary | ICD-10-CM | POA: Diagnosis not present

## 2021-09-15 DIAGNOSIS — Z01419 Encounter for gynecological examination (general) (routine) without abnormal findings: Secondary | ICD-10-CM | POA: Diagnosis not present

## 2021-09-15 DIAGNOSIS — Z6834 Body mass index (BMI) 34.0-34.9, adult: Secondary | ICD-10-CM | POA: Diagnosis not present

## 2021-09-18 ENCOUNTER — Ambulatory Visit
Admission: RE | Admit: 2021-09-18 | Discharge: 2021-09-18 | Disposition: A | Discharge status: Home / Self Care | Payer: 59 | Source: Ambulatory Visit | Attending: Gastroenterology | Admitting: Gastroenterology

## 2021-09-18 DIAGNOSIS — R7989 Other specified abnormal findings of blood chemistry: Secondary | ICD-10-CM | POA: Diagnosis not present

## 2021-09-18 DIAGNOSIS — R945 Abnormal results of liver function studies: Secondary | ICD-10-CM | POA: Diagnosis not present

## 2021-09-18 DIAGNOSIS — K760 Fatty (change of) liver, not elsewhere classified: Secondary | ICD-10-CM | POA: Diagnosis not present

## 2021-09-22 DIAGNOSIS — H019 Unspecified inflammation of eyelid: Secondary | ICD-10-CM | POA: Diagnosis not present

## 2021-09-22 DIAGNOSIS — H04223 Epiphora due to insufficient drainage, bilateral lacrimal glands: Secondary | ICD-10-CM | POA: Diagnosis not present

## 2021-09-22 DIAGNOSIS — H04543 Stenosis of bilateral lacrimal canaliculi: Secondary | ICD-10-CM | POA: Diagnosis not present

## 2021-09-22 DIAGNOSIS — D23112 Other benign neoplasm of skin of right lower eyelid, including canthus: Secondary | ICD-10-CM | POA: Diagnosis not present

## 2021-09-22 DIAGNOSIS — H04551 Acquired stenosis of right nasolacrimal duct: Secondary | ICD-10-CM | POA: Diagnosis not present

## 2021-10-16 ENCOUNTER — Emergency Department (HOSPITAL_BASED_OUTPATIENT_CLINIC_OR_DEPARTMENT_OTHER)
Admission: EM | Admit: 2021-10-16 | Discharge: 2021-10-16 | Disposition: A | Discharge status: Home / Self Care | Payer: BC Managed Care – PPO | Attending: Emergency Medicine | Admitting: Emergency Medicine

## 2021-10-16 ENCOUNTER — Encounter (HOSPITAL_BASED_OUTPATIENT_CLINIC_OR_DEPARTMENT_OTHER): Payer: Self-pay | Admitting: *Deleted

## 2021-10-16 ENCOUNTER — Emergency Department (HOSPITAL_BASED_OUTPATIENT_CLINIC_OR_DEPARTMENT_OTHER): Payer: BC Managed Care – PPO

## 2021-10-16 ENCOUNTER — Other Ambulatory Visit: Payer: Self-pay

## 2021-10-16 DIAGNOSIS — I10 Essential (primary) hypertension: Secondary | ICD-10-CM | POA: Insufficient documentation

## 2021-10-16 DIAGNOSIS — Z8719 Personal history of other diseases of the digestive system: Secondary | ICD-10-CM | POA: Diagnosis not present

## 2021-10-16 DIAGNOSIS — K625 Hemorrhage of anus and rectum: Secondary | ICD-10-CM | POA: Insufficient documentation

## 2021-10-16 DIAGNOSIS — Z79899 Other long term (current) drug therapy: Secondary | ICD-10-CM | POA: Diagnosis not present

## 2021-10-16 DIAGNOSIS — Z87891 Personal history of nicotine dependence: Secondary | ICD-10-CM | POA: Diagnosis not present

## 2021-10-16 DIAGNOSIS — I700 Atherosclerosis of aorta: Secondary | ICD-10-CM | POA: Diagnosis not present

## 2021-10-16 DIAGNOSIS — R1032 Left lower quadrant pain: Secondary | ICD-10-CM | POA: Diagnosis not present

## 2021-10-16 HISTORY — DX: Essential (primary) hypertension: I10

## 2021-10-16 HISTORY — DX: Dermatitis, unspecified: L30.9

## 2021-10-16 LAB — COMPREHENSIVE METABOLIC PANEL
ALT: 28 U/L (ref 0–44)
AST: 18 U/L (ref 15–41)
Albumin: 4.3 g/dL (ref 3.5–5.0)
Alkaline Phosphatase: 97 U/L (ref 38–126)
Anion gap: 9 (ref 5–15)
BUN: 25 mg/dL — ABNORMAL HIGH (ref 6–20)
CO2: 26 mmol/L (ref 22–32)
Calcium: 9.3 mg/dL (ref 8.9–10.3)
Chloride: 101 mmol/L (ref 98–111)
Creatinine, Ser: 1.42 mg/dL — ABNORMAL HIGH (ref 0.44–1.00)
GFR, Estimated: 44 mL/min — ABNORMAL LOW (ref >60)
Glucose, Bld: 124 mg/dL — ABNORMAL HIGH (ref 70–99)
Potassium: 4 mmol/L (ref 3.5–5.1)
Sodium: 136 mmol/L (ref 135–145)
Total Bilirubin: 0.5 mg/dL (ref 0.3–1.2)
Total Protein: 7.4 g/dL (ref 6.5–8.1)

## 2021-10-16 LAB — CBC
HCT: 38.7 % (ref 36.0–46.0)
Hemoglobin: 13 g/dL (ref 12.0–15.0)
MCH: 29.2 pg (ref 26.0–34.0)
MCHC: 33.6 g/dL (ref 30.0–36.0)
MCV: 87 fL (ref 80.0–100.0)
Platelets: 318 10*3/uL (ref 150–400)
RBC: 4.45 MIL/uL (ref 3.87–5.11)
RDW: 13.7 % (ref 11.5–15.5)
WBC: 8.9 10*3/uL (ref 4.0–10.5)
nRBC: 0 % (ref 0.0–0.2)

## 2021-10-16 LAB — URINALYSIS, ROUTINE W REFLEX MICROSCOPIC
Bilirubin Urine: NEGATIVE
Glucose, UA: NEGATIVE mg/dL
Hgb urine dipstick: NEGATIVE
Ketones, ur: NEGATIVE mg/dL
Leukocytes,Ua: NEGATIVE
Nitrite: NEGATIVE
Protein, ur: NEGATIVE mg/dL
Specific Gravity, Urine: 1.02 (ref 1.005–1.030)
pH: 7 (ref 5.0–8.0)

## 2021-10-16 LAB — LIPASE, BLOOD: Lipase: 27 U/L (ref 11–51)

## 2021-10-16 MED ORDER — IOHEXOL 300 MG/ML  SOLN
100.0000 mL | Freq: Once | INTRAMUSCULAR | Status: AC | PRN
  Administered 2021-10-16: 80 mL via INTRAVENOUS

## 2021-10-16 NOTE — Discharge Instructions (Signed)
We discussed your work-up today was unremarkable, your hemoglobin, and other lab work did not show any abnormalities, I do not see any gross bleeding, or evidence of hemorrhoids on your rectal exam, and your CT did not show any evidence of diverticulitis at this time.  I do recommend that you stick to a full liquid diet at this time, I have attached instructions on which foods qualify.  Please follow-up with your gastroenterologist in the morning to decide what they would like to do next as far as work-up.  If your symptoms worsen or fail to improve, or the amount of bleeding significantly increases in the meantime please return for further evaluation.

## 2021-10-16 NOTE — ED Provider Notes (Signed)
[facility] EMERGENCY DEPARTMENT Provider Note   CSN: [id number] Arrival date & time: 10/16/21  1826     History Chief Complaint  Patient presents with   Rectal Bleeding    Tracy Reilly is a 55 y.o. female with a history of colitis, and a colonoscopy earlier this year which showed diverticular disease who presents with crampy left lower quadrant abdominal pain, and diarrhea today with rectal bleeding with some clots.  Patient reports no rectal bleeding between episodes of bowel movement, no blood on her underwear between bowel movements.  Patient endorses no nausea, vomiting, fever, chills.  Patient denies recent illness.  Patient denies dietary changes.  Patient is not taking any blood thinners.  Patient reports no significant pain at this time, does not know any clear triggers of her abdominal cramping or rectal bleeding.  Have been evaluated by gastroenterology before, placed on Flagyl, and given liquid diet last time with improvement of symptoms, and a follow-up CT that showed no evidence of diverticular inflammation.   Rectal Bleeding Associated symptoms: abdominal pain       Past Medical History:  Diagnosis Date   Eczema    Hypertension     Patient Active Problem List   Diagnosis Date Noted   Plantar fasciitis, left 05/16/2017    Past Surgical History:  Procedure Laterality Date   TONSILLECTOMY       OB History   No obstetric history on file.     No family history on file.  Social History   Tobacco Use   Smoking status: Former    Types: Cigarettes   Smokeless tobacco: Never  Substance Use Topics   Alcohol use: No   Drug use: No    Home Medications Prior to Admission medications   Medication Sig Start Date End Date Taking? Authorizing Provider  amLODipine (NORVASC) 5 MG tablet  05/13/17   [provider]  BLISOVI FE 1.5/30 1.5-30 MG-MCG tablet  03/18/17   [provider]  lisinopril (PRINIVIL,ZESTRIL) 20 MG tablet  05/13/17    [provider]  Multiple Vitamin (MULTI VITAMIN DAILY PO) Take by mouth.    [provider]    Allergies    Patient has no known allergies.  Review of Systems   Review of Systems  Gastrointestinal:  Positive for abdominal pain and hematochezia.  All other systems reviewed and are negative.  Physical Exam Updated Vital Signs BP 123/65    Pulse 83    Temp 98.3 F (36.8 C) (Oral)    Resp 16    Ht 5\' 6"  (1.676 m)    Wt 95.3 kg    SpO2 97%    BMI 33.89 kg/m   Physical Exam Vitals and nursing note reviewed.  Constitutional:      General: She is not in acute distress.    Appearance: Normal appearance.  HENT:     Head: Normocephalic and atraumatic.  Eyes:     General:        Right eye: No discharge.        Left eye: No discharge.  Cardiovascular:     Rate and Rhythm: Normal rate and regular rhythm.     Heart sounds: No murmur heard.   No friction rub. No gallop.  Pulmonary:     Effort: Pulmonary effort is normal.     Breath sounds: Normal breath sounds.  Abdominal:     General: Bowel sounds are normal.     Palpations: Abdomen is soft.  Comments: Some tenderness to palpation left lower quadrant.  Minimal tenderness to palpation throughout the rest of the abdomen diffusely.  There is no rebound, rigidity, guarding throughout.  Normal bowel sounds throughout.  Genitourinary:    Comments: Normal rectal tone, no feeling of internal or external thrombosed hemorrhoids.  There is no stool in the rectal vault, there is no active bleeding from the rectum. Skin:    General: Skin is warm and dry.     Capillary Refill: Capillary refill takes less than 2 seconds.  Neurological:     Mental Status: She is alert and oriented to person, place, and time.  Psychiatric:        Mood and Affect: Mood normal.        Behavior: Behavior normal.    ED Results / Procedures / Treatments   Labs (all labs ordered are listed, but only abnormal results are displayed) Labs  Reviewed  COMPREHENSIVE METABOLIC PANEL - Abnormal; Notable for the following components:      Result Value   Glucose, Bld 124 (*)    BUN 25 (*)    Creatinine, Ser 1.42 (*)    GFR, Estimated 44 (*)    All other components within normal limits  CBC  LIPASE, BLOOD  URINALYSIS, ROUTINE W REFLEX MICROSCOPIC    EKG None  Radiology CT ABDOMEN PELVIS W CONTRAST  Result Date: 10/16/2021 CLINICAL DATA:  Left lower quadrant pain, rectal bleeding EXAM: CT ABDOMEN AND PELVIS WITH CONTRAST TECHNIQUE: Multidetector CT imaging of the abdomen and pelvis was performed using the standard protocol following bolus administration of intravenous contrast. CONTRAST:  62mL OMNIPAQUE IOHEXOL 300 MG/ML  SOLN COMPARISON:  None. FINDINGS: Lower chest: Lung bases are clear. No effusions. Heart is normal size. Hepatobiliary: No focal hepatic abnormality. Gallbladder unremarkable. Pancreas: No focal abnormality or ductal dilatation. Spleen: No focal abnormality.  Normal size. Adrenals/Urinary Tract: No adrenal abnormality. No focal renal abnormality. No stones or hydronephrosis. Urinary bladder is unremarkable. Stomach/Bowel: Normal appendix. Stomach, large and small bowel grossly unremarkable. Vascular/Lymphatic: Aortic atherosclerosis. No evidence of aneurysm or adenopathy. Reproductive: Uterus and adnexa unremarkable.  No mass. Other: No free fluid or free air. Musculoskeletal: No acute bony abnormality. IMPRESSION: No acute findings in the abdomen or pelvis. Electronically Signed   By: Rolm Baptise M.D.   On: 10/16/2021 22:09    Procedures Procedures   Medications Ordered in ED Medications  iohexol (OMNIPAQUE) 300 MG/ML solution 100 mL (80 mLs Intravenous Contrast Given 10/16/21 2149)    ED Course  I have reviewed the triage vital signs and the nursing notes.  Pertinent labs & imaging results that were available during my care of the patient were reviewed by me and considered in my medical decision making (see  chart for details).    MDM Rules/Calculators/A&P                         I discussed this case with my attending physician who cosigned this note including patient's presenting symptoms, physical exam, and planned diagnostics and interventions. Attending physician stated agreement with plan or made changes to plan which were implemented.   Overall well-appearing patient with concerning report of rectal bleeding x1 with some abdominal cramping.  Patient with history of colitis x1, as well as known diverticular disease on colonoscopy earlier this year.  Patient appears overall well, stable vital signs, minimal pain on exam, other than some report of occasional cramping.  Patient does have some focal  tenderness in left lower quadrant.  We will obtain CT abdomen pelvis at this time to evaluate for diverticulitis.  Lab work unremarkable, other than minimally elevated creatinine, BUN, with no baseline on file.  Encourage fluid intake, reevaluation with primary care doctor.  Patient with mild hyperglycemia.  Unremarkable urinalysis, lipase, CBC.  CT abdomen pelvis does not show any acute intra-abdominal findings.  Rectal exam normal, no active rectal bleeding at this time.  In context of no acute findings, unremarkable CT scan will discharge patient at this time.  Given her history of known diverticular disease, and acute rectal bleeding, encouraged full liquid diet, and close follow-up with GI.  Do not believe that patient qualifies for antibiotics at this time.  Patient discharged in stable condition, return precautions given. Final Clinical Impression(s) / ED Diagnoses Final diagnoses:  Rectal bleeding    Rx / DC Orders ED Discharge Orders     None        Anselmo Pickler, PA-C 10/17/21 0005    Malvin Johns, MD 10/17/21 [phone]

## 2021-10-16 NOTE — ED Notes (Signed)
Patient discharged to home.  All discharge instructions reviewed.  Patient verbalized understanding via teachback method.  VS WDL.  Respirations even and unlabored.  Ambulatory out of ED.   °

## 2021-10-16 NOTE — ED Triage Notes (Signed)
C/o rectal bleeding with clots x 2 episodes with  left lower abd.

## 2021-11-14 DIAGNOSIS — M791 Myalgia, unspecified site: Secondary | ICD-10-CM | POA: Diagnosis not present

## 2021-11-14 DIAGNOSIS — Z0000 Encounter for general adult medical examination without abnormal findings: Secondary | ICD-10-CM | POA: Diagnosis not present

## 2021-11-14 DIAGNOSIS — K760 Fatty (change of) liver, not elsewhere classified: Secondary | ICD-10-CM | POA: Diagnosis not present

## 2021-11-14 DIAGNOSIS — K635 Polyp of colon: Secondary | ICD-10-CM | POA: Diagnosis not present

## 2021-11-14 DIAGNOSIS — I700 Atherosclerosis of aorta: Secondary | ICD-10-CM | POA: Diagnosis not present

## 2021-11-14 DIAGNOSIS — E669 Obesity, unspecified: Secondary | ICD-10-CM | POA: Diagnosis not present

## 2021-11-14 DIAGNOSIS — I10 Essential (primary) hypertension: Secondary | ICD-10-CM | POA: Diagnosis not present

## 2021-11-14 DIAGNOSIS — Z13220 Encounter for screening for lipoid disorders: Secondary | ICD-10-CM | POA: Diagnosis not present

## 2021-11-14 DIAGNOSIS — E559 Vitamin D deficiency, unspecified: Secondary | ICD-10-CM | POA: Diagnosis not present

## 2021-12-02 DIAGNOSIS — B349 Viral infection, unspecified: Secondary | ICD-10-CM | POA: Diagnosis not present

## 2021-12-02 DIAGNOSIS — Z03818 Encounter for observation for suspected exposure to other biological agents ruled out: Secondary | ICD-10-CM | POA: Diagnosis not present

## 2021-12-02 DIAGNOSIS — R509 Fever, unspecified: Secondary | ICD-10-CM | POA: Diagnosis not present

## 2021-12-02 DIAGNOSIS — R051 Acute cough: Secondary | ICD-10-CM | POA: Diagnosis not present

## 2021-12-02 DIAGNOSIS — R059 Cough, unspecified: Secondary | ICD-10-CM | POA: Diagnosis not present

## 2021-12-04 DIAGNOSIS — R051 Acute cough: Secondary | ICD-10-CM | POA: Diagnosis not present

## 2021-12-19 DIAGNOSIS — H9201 Otalgia, right ear: Secondary | ICD-10-CM | POA: Diagnosis not present

## 2021-12-29 DIAGNOSIS — H04411 Chronic dacryocystitis of right lacrimal passage: Secondary | ICD-10-CM | POA: Diagnosis not present

## 2021-12-29 DIAGNOSIS — H04551 Acquired stenosis of right nasolacrimal duct: Secondary | ICD-10-CM | POA: Diagnosis not present

## 2021-12-29 DIAGNOSIS — H04221 Epiphora due to insufficient drainage, right lacrimal gland: Secondary | ICD-10-CM | POA: Diagnosis not present

## 2021-12-29 DIAGNOSIS — H04561 Stenosis of right lacrimal punctum: Secondary | ICD-10-CM | POA: Diagnosis not present

## 2022-01-13 DIAGNOSIS — H04551 Acquired stenosis of right nasolacrimal duct: Secondary | ICD-10-CM | POA: Diagnosis not present

## 2022-01-13 DIAGNOSIS — H019 Unspecified inflammation of eyelid: Secondary | ICD-10-CM | POA: Diagnosis not present

## 2022-01-13 DIAGNOSIS — H04221 Epiphora due to insufficient drainage, right lacrimal gland: Secondary | ICD-10-CM | POA: Diagnosis not present

## 2022-01-13 DIAGNOSIS — H04541 Stenosis of right lacrimal canaliculi: Secondary | ICD-10-CM | POA: Diagnosis not present

## 2022-01-22 DIAGNOSIS — M2550 Pain in unspecified joint: Secondary | ICD-10-CM | POA: Diagnosis not present

## 2022-01-22 DIAGNOSIS — M2559 Pain in other specified joint: Secondary | ICD-10-CM | POA: Diagnosis not present

## 2022-01-22 DIAGNOSIS — E559 Vitamin D deficiency, unspecified: Secondary | ICD-10-CM | POA: Diagnosis not present

## 2022-02-12 DIAGNOSIS — H02834 Dermatochalasis of left upper eyelid: Secondary | ICD-10-CM | POA: Diagnosis not present

## 2022-02-12 DIAGNOSIS — H02413 Mechanical ptosis of bilateral eyelids: Secondary | ICD-10-CM | POA: Diagnosis not present

## 2022-02-12 DIAGNOSIS — H02831 Dermatochalasis of right upper eyelid: Secondary | ICD-10-CM | POA: Diagnosis not present

## 2022-02-12 DIAGNOSIS — H04221 Epiphora due to insufficient drainage, right lacrimal gland: Secondary | ICD-10-CM | POA: Diagnosis not present

## 2022-02-12 DIAGNOSIS — H04551 Acquired stenosis of right nasolacrimal duct: Secondary | ICD-10-CM | POA: Diagnosis not present

## 2022-02-12 DIAGNOSIS — H019 Unspecified inflammation of eyelid: Secondary | ICD-10-CM | POA: Diagnosis not present

## 2022-02-18 DIAGNOSIS — M1990 Unspecified osteoarthritis, unspecified site: Secondary | ICD-10-CM | POA: Diagnosis not present

## 2022-02-18 DIAGNOSIS — M79671 Pain in right foot: Secondary | ICD-10-CM | POA: Diagnosis not present

## 2022-02-18 DIAGNOSIS — M2550 Pain in unspecified joint: Secondary | ICD-10-CM | POA: Diagnosis not present

## 2022-02-18 DIAGNOSIS — M79642 Pain in left hand: Secondary | ICD-10-CM | POA: Diagnosis not present

## 2022-02-18 DIAGNOSIS — M791 Myalgia, unspecified site: Secondary | ICD-10-CM | POA: Diagnosis not present

## 2022-02-18 DIAGNOSIS — M79641 Pain in right hand: Secondary | ICD-10-CM | POA: Diagnosis not present

## 2022-02-18 DIAGNOSIS — M79672 Pain in left foot: Secondary | ICD-10-CM | POA: Diagnosis not present

## 2022-03-06 DIAGNOSIS — D225 Melanocytic nevi of trunk: Secondary | ICD-10-CM | POA: Diagnosis not present

## 2022-03-06 DIAGNOSIS — L578 Other skin changes due to chronic exposure to nonionizing radiation: Secondary | ICD-10-CM | POA: Diagnosis not present

## 2022-03-06 DIAGNOSIS — L2084 Intrinsic (allergic) eczema: Secondary | ICD-10-CM | POA: Diagnosis not present

## 2022-03-06 DIAGNOSIS — L821 Other seborrheic keratosis: Secondary | ICD-10-CM | POA: Diagnosis not present

## 2022-03-25 DIAGNOSIS — L209 Atopic dermatitis, unspecified: Secondary | ICD-10-CM | POA: Diagnosis not present

## 2022-03-25 DIAGNOSIS — M791 Myalgia, unspecified site: Secondary | ICD-10-CM | POA: Diagnosis not present

## 2022-03-25 DIAGNOSIS — M2550 Pain in unspecified joint: Secondary | ICD-10-CM | POA: Diagnosis not present

## 2022-03-25 DIAGNOSIS — M1990 Unspecified osteoarthritis, unspecified site: Secondary | ICD-10-CM | POA: Diagnosis not present

## 2022-04-22 DIAGNOSIS — M1990 Unspecified osteoarthritis, unspecified site: Secondary | ICD-10-CM | POA: Diagnosis not present

## 2022-05-20 DIAGNOSIS — M1990 Unspecified osteoarthritis, unspecified site: Secondary | ICD-10-CM | POA: Diagnosis not present

## 2022-05-20 DIAGNOSIS — L209 Atopic dermatitis, unspecified: Secondary | ICD-10-CM | POA: Diagnosis not present

## 2022-05-20 DIAGNOSIS — M2550 Pain in unspecified joint: Secondary | ICD-10-CM | POA: Diagnosis not present

## 2022-05-20 DIAGNOSIS — Z79899 Other long term (current) drug therapy: Secondary | ICD-10-CM | POA: Diagnosis not present

## 2022-05-21 DIAGNOSIS — Z79899 Other long term (current) drug therapy: Secondary | ICD-10-CM | POA: Diagnosis not present

## 2022-05-21 DIAGNOSIS — M2550 Pain in unspecified joint: Secondary | ICD-10-CM | POA: Diagnosis not present

## 2022-05-21 DIAGNOSIS — M791 Myalgia, unspecified site: Secondary | ICD-10-CM | POA: Diagnosis not present

## 2022-05-21 DIAGNOSIS — M1990 Unspecified osteoarthritis, unspecified site: Secondary | ICD-10-CM | POA: Diagnosis not present

## 2022-08-11 DIAGNOSIS — M2550 Pain in unspecified joint: Secondary | ICD-10-CM | POA: Diagnosis not present

## 2022-08-11 DIAGNOSIS — M1990 Unspecified osteoarthritis, unspecified site: Secondary | ICD-10-CM | POA: Diagnosis not present

## 2022-08-11 DIAGNOSIS — Z79899 Other long term (current) drug therapy: Secondary | ICD-10-CM | POA: Diagnosis not present

## 2022-08-11 DIAGNOSIS — L209 Atopic dermatitis, unspecified: Secondary | ICD-10-CM | POA: Diagnosis not present

## 2022-09-09 DIAGNOSIS — L2084 Intrinsic (allergic) eczema: Secondary | ICD-10-CM | POA: Diagnosis not present

## 2022-09-14 DIAGNOSIS — M1990 Unspecified osteoarthritis, unspecified site: Secondary | ICD-10-CM | POA: Diagnosis not present

## 2022-10-12 DIAGNOSIS — Z1231 Encounter for screening mammogram for malignant neoplasm of breast: Secondary | ICD-10-CM | POA: Diagnosis not present

## 2022-10-12 DIAGNOSIS — Z6834 Body mass index (BMI) 34.0-34.9, adult: Secondary | ICD-10-CM | POA: Diagnosis not present

## 2022-10-12 DIAGNOSIS — Z13820 Encounter for screening for osteoporosis: Secondary | ICD-10-CM | POA: Diagnosis not present

## 2022-10-12 DIAGNOSIS — Z01419 Encounter for gynecological examination (general) (routine) without abnormal findings: Secondary | ICD-10-CM | POA: Diagnosis not present

## 2022-10-13 ENCOUNTER — Other Ambulatory Visit: Payer: Self-pay | Admitting: Obstetrics & Gynecology

## 2022-10-13 DIAGNOSIS — R928 Other abnormal and inconclusive findings on diagnostic imaging of breast: Secondary | ICD-10-CM

## 2022-10-16 ENCOUNTER — Ambulatory Visit: Admission: RE | Admit: 2022-10-16 | Payer: 59 | Source: Ambulatory Visit

## 2022-10-16 ENCOUNTER — Ambulatory Visit
Admission: RE | Admit: 2022-10-16 | Discharge: 2022-10-16 | Disposition: A | Discharge status: Home / Self Care | Payer: BC Managed Care – PPO | Source: Ambulatory Visit | Attending: Obstetrics & Gynecology | Admitting: Obstetrics & Gynecology

## 2022-10-16 DIAGNOSIS — R928 Other abnormal and inconclusive findings on diagnostic imaging of breast: Secondary | ICD-10-CM | POA: Diagnosis not present

## 2022-10-20 DIAGNOSIS — H0288B Meibomian gland dysfunction left eye, upper and lower eyelids: Secondary | ICD-10-CM | POA: Diagnosis not present

## 2022-10-20 DIAGNOSIS — H16223 Keratoconjunctivitis sicca, not specified as Sjogren's, bilateral: Secondary | ICD-10-CM | POA: Diagnosis not present

## 2022-10-20 DIAGNOSIS — H04213 Epiphora due to excess lacrimation, bilateral lacrimal glands: Secondary | ICD-10-CM | POA: Diagnosis not present

## 2022-10-28 ENCOUNTER — Other Ambulatory Visit: Payer: 59

## 2022-11-25 DIAGNOSIS — E559 Vitamin D deficiency, unspecified: Secondary | ICD-10-CM | POA: Diagnosis not present

## 2022-11-25 DIAGNOSIS — E781 Pure hyperglyceridemia: Secondary | ICD-10-CM | POA: Diagnosis not present

## 2022-11-25 DIAGNOSIS — I10 Essential (primary) hypertension: Secondary | ICD-10-CM | POA: Diagnosis not present

## 2022-11-25 DIAGNOSIS — Z23 Encounter for immunization: Secondary | ICD-10-CM | POA: Diagnosis not present

## 2022-11-25 DIAGNOSIS — Z0000 Encounter for general adult medical examination without abnormal findings: Secondary | ICD-10-CM | POA: Diagnosis not present

## 2022-11-30 DIAGNOSIS — Z6835 Body mass index (BMI) 35.0-35.9, adult: Secondary | ICD-10-CM | POA: Diagnosis not present

## 2022-11-30 DIAGNOSIS — E785 Hyperlipidemia, unspecified: Secondary | ICD-10-CM | POA: Diagnosis not present

## 2022-11-30 DIAGNOSIS — I10 Essential (primary) hypertension: Secondary | ICD-10-CM | POA: Diagnosis not present

## 2022-11-30 DIAGNOSIS — R7303 Prediabetes: Secondary | ICD-10-CM | POA: Diagnosis not present

## 2022-12-09 DIAGNOSIS — R051 Acute cough: Secondary | ICD-10-CM | POA: Diagnosis not present

## 2022-12-09 DIAGNOSIS — Z03818 Encounter for observation for suspected exposure to other biological agents ruled out: Secondary | ICD-10-CM | POA: Diagnosis not present

## 2022-12-09 DIAGNOSIS — B309 Viral conjunctivitis, unspecified: Secondary | ICD-10-CM | POA: Diagnosis not present

## 2022-12-21 DIAGNOSIS — H938X3 Other specified disorders of ear, bilateral: Secondary | ICD-10-CM | POA: Diagnosis not present

## 2022-12-21 DIAGNOSIS — R0981 Nasal congestion: Secondary | ICD-10-CM | POA: Diagnosis not present

## 2022-12-30 IMAGING — US US ABDOMEN LIMITED
1 series · 14 of 25 positions shown · non-contrast
Comparison: 09/01/2021

CLINICAL DATA: Elevated LFTs

EXAM:
ULTRASOUND ABDOMEN LIMITED RIGHT UPPER QUADRANT

[Series 1: us abdomen limited · 0.20mm/px · 14 of 47 slices shown]
[im 1/47]
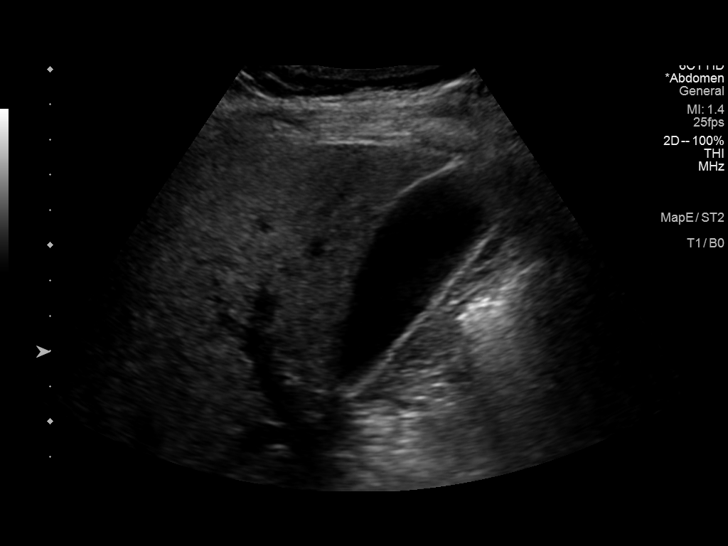
[im 4/47]
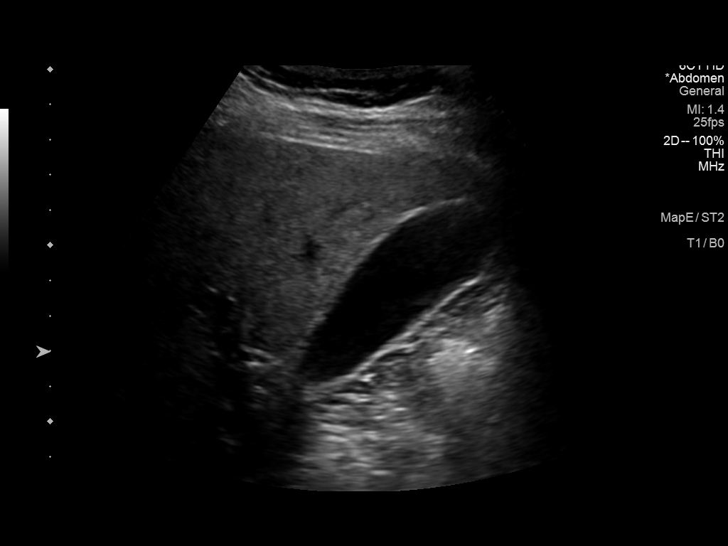
[im 8/47]
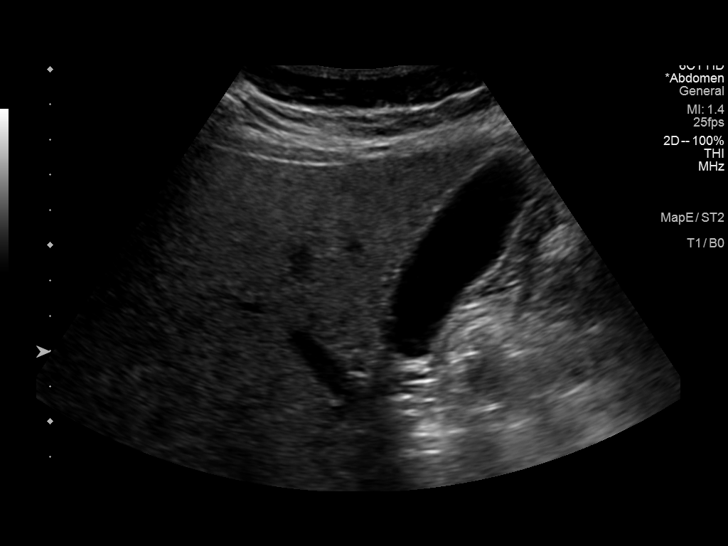
[im 12/47]
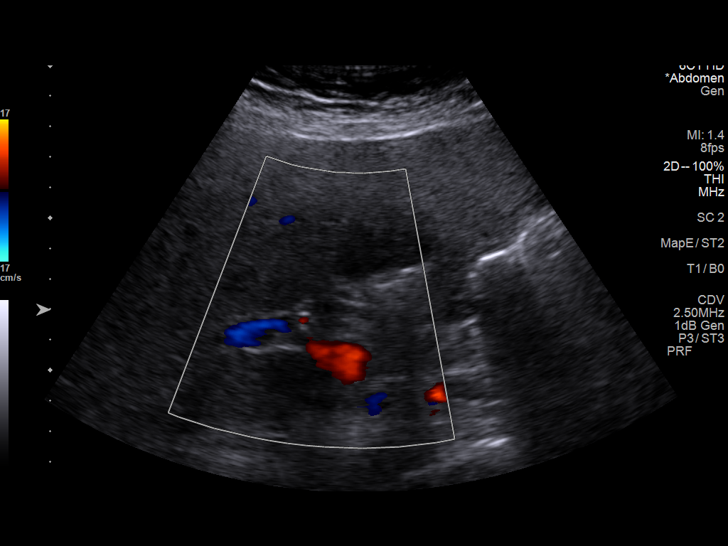
[im 16/47]
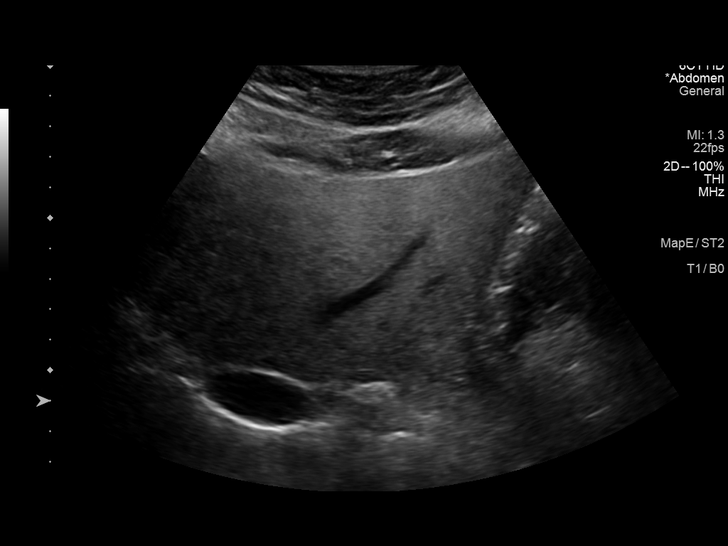
[im 18/47]
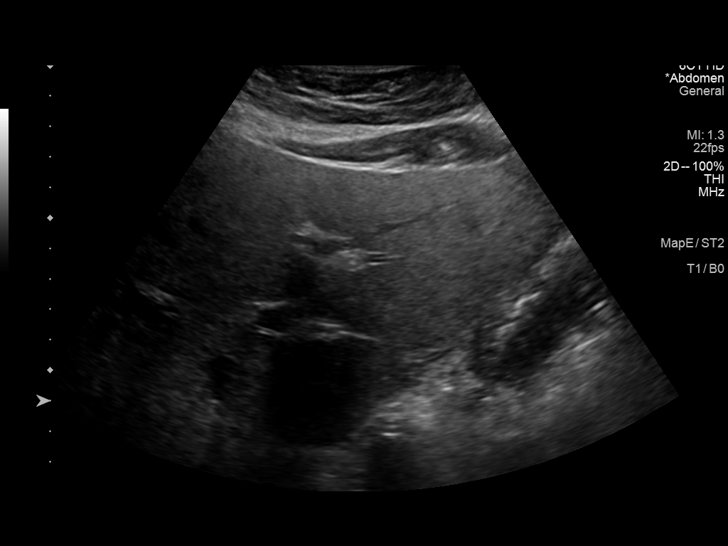
[im 22/47]
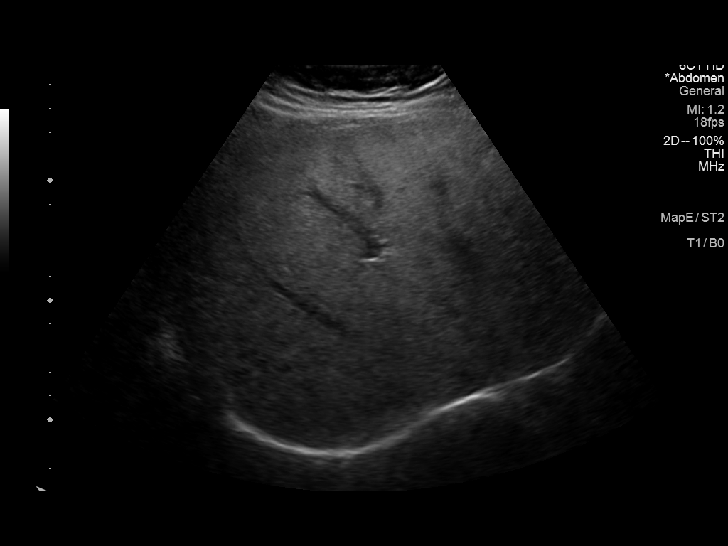
[im 25/47]
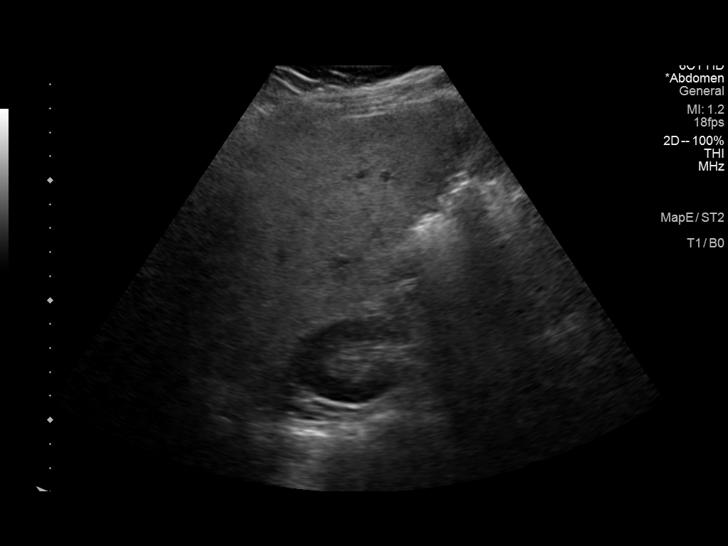
[im 29/47]
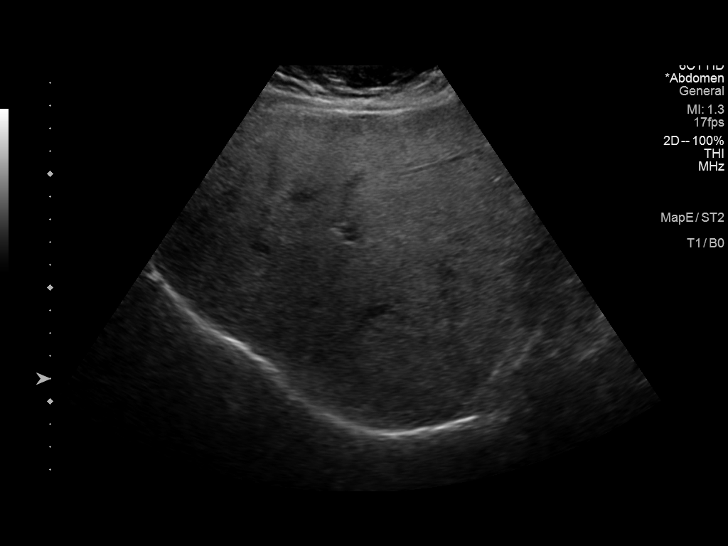
[im 31/47]
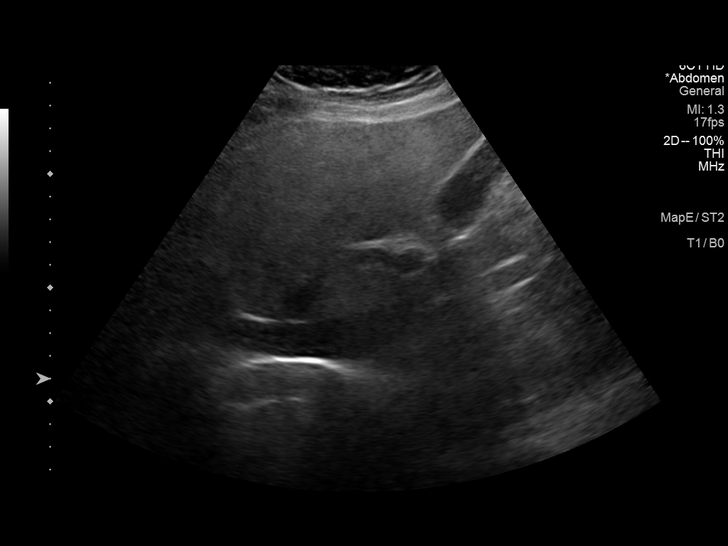
[im 35/47]
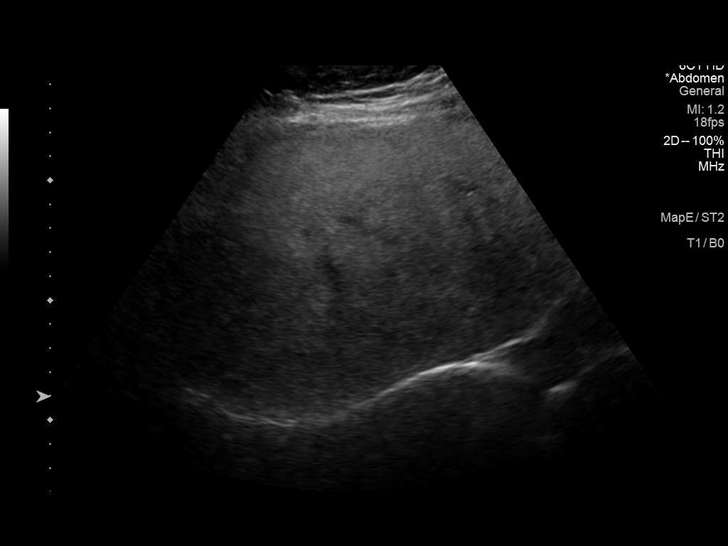
[im 39/47]
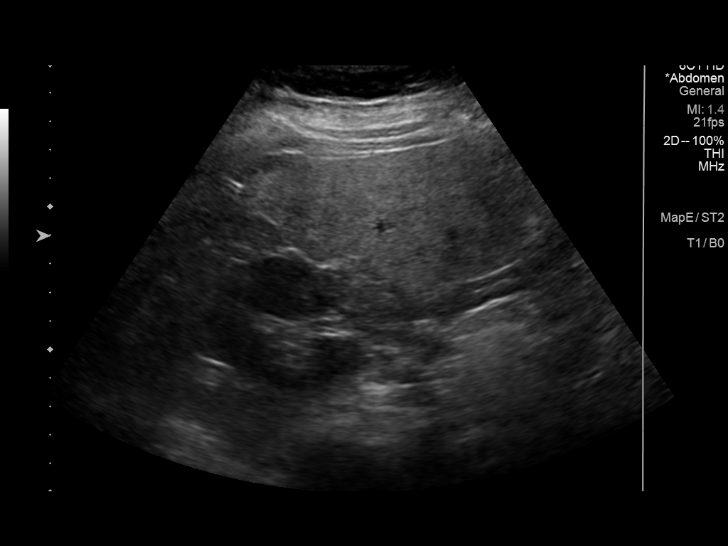
[im 43/47]
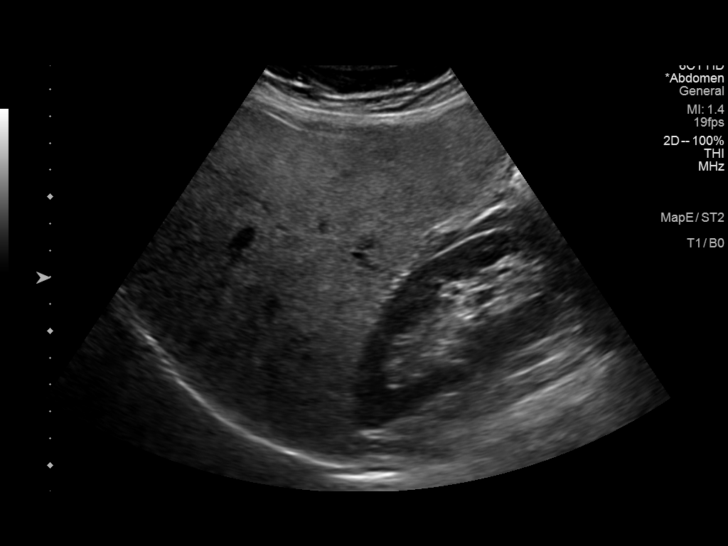
[im 47/47]
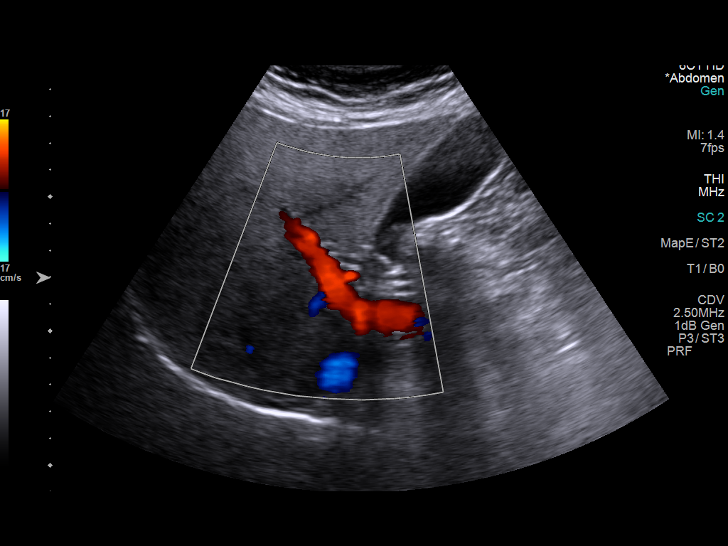

[14 of 25 positions shown; findings below may reference images not displayed]

FINDINGS: Gallbladder:

No gallstones or wall thickening visualized. No sonographic Murphy
sign noted by sonographer.

Common bile duct:

Diameter: 3.8 mm.

Liver:

Diffusely increased in echogenicity consistent with fatty
infiltration. No focal mass is noted. Portal vein is patent on color
Doppler imaging with normal direction of blood flow towards the
liver.

Other: None.
IMPRESSION: Fatty liver.

## 2023-01-11 DIAGNOSIS — M064 Inflammatory polyarthropathy: Secondary | ICD-10-CM | POA: Diagnosis not present

## 2023-01-16 DIAGNOSIS — R42 Dizziness and giddiness: Secondary | ICD-10-CM | POA: Diagnosis not present

## 2023-01-16 DIAGNOSIS — R109 Unspecified abdominal pain: Secondary | ICD-10-CM | POA: Diagnosis not present

## 2023-03-02 ENCOUNTER — Ambulatory Visit (INDEPENDENT_AMBULATORY_CARE_PROVIDER_SITE_OTHER): Payer: BC Managed Care – PPO | Admitting: Family Medicine

## 2023-03-02 VITALS — BP 138/82 | HR 71 | Temp 97.6°F | Ht 66.5 in | Wt 210.0 lb

## 2023-03-02 DIAGNOSIS — R7303 Prediabetes: Secondary | ICD-10-CM | POA: Diagnosis not present

## 2023-03-02 DIAGNOSIS — Z6833 Body mass index (BMI) 33.0-33.9, adult: Secondary | ICD-10-CM

## 2023-03-02 DIAGNOSIS — E669 Obesity, unspecified: Secondary | ICD-10-CM | POA: Diagnosis not present

## 2023-03-02 DIAGNOSIS — E559 Vitamin D deficiency, unspecified: Secondary | ICD-10-CM

## 2023-03-02 DIAGNOSIS — I10 Essential (primary) hypertension: Secondary | ICD-10-CM

## 2023-03-02 DIAGNOSIS — Z0289 Encounter for other administrative examinations: Secondary | ICD-10-CM

## 2023-03-02 NOTE — Assessment & Plan Note (Signed)
She reports a recent diagnosis of prediabetes and a fam hx of type II diabetes.  She does consume excess starches/ sweets with no regular exercise.  She would like to see A1c reduction with weight loss.  She recently started metformin 500 mg once daily with food with some loose stools.  Continue metformin per PCP. She agrees to bring in a copy of her most recent labs Begin cutting out SSBs

## 2023-03-02 NOTE — Assessment & Plan Note (Signed)
BP is at the [facility] on Amlodopine 5 mg daily and Lisinopril 20 mg daily.  She is tolerating both medications well and hopes to see improvements in her BP with weight loss.  Continue current BP medications. Begin active plan for weight reduction

## 2023-03-02 NOTE — Progress Notes (Signed)
Office: [phone]  /  Fax: [phone]   Initial Visit  Tracy Reilly was seen in clinic today to evaluate for obesity. She is interested in losing weight to improve overall health and reduce the risk of weight related complications. She presents today to review program treatment options, initial physical assessment, and evaluation.     She was referred by: Friend or Family  When asked what else they would like to accomplish? She states: Improve existing medical conditions and Improve quality of life  Weight history: weight has been stable but she has prediabetes now so she wants to do something  When asked how has your weight affected you? She states: Having fatigue  Some associated conditions: Hypertension, Hyperlipidemia, and Prediabetes  Contributing factors: Family history, Stress, Reduced physical activity, and Menopause Desk job  Weight promoting medications identified: None  Current nutrition plan: None  avoids fish, beef, little fruit, some veggies  Current level of physical activity: None and NEAT  Current or previous pharmacotherapy: Other: Xenical  Response to medication: Other: worked well for a period of time   Past medical history includes:   Past Medical History:  Diagnosis Date   Eczema    Hypertension      Objective:   BP 138/82   Pulse 71   Temp 97.6 F (36.4 C)   Ht 5' 6.5" (1.689 m)   Wt 210 lb (95.3 kg)   SpO2 99%   BMI 33.39 kg/m  She was weighed on the bioimpedance scale: Body mass index is 33.39 kg/m.  Peak Weight:205 lb , Body Fat%:43.1, Visceral Fat Rating:11, Weight trend over the last 12 months: Unchanged  General:  Alert, oriented and cooperative. Patient is in no acute distress.  Respiratory: Normal respiratory effort, no problems with respiration noted   Gait: able to ambulate independently  Mental Status: Normal mood and affect. Normal behavior. Normal judgment and thought content.   DIAGNOSTIC DATA REVIEWED:  BMET     Component Value Date/Time   NA 136 10/16/2021 1901   K 4.0 10/16/2021 1901   CL 101 10/16/2021 1901   CO2 26 10/16/2021 1901   GLUCOSE 124 (H) 10/16/2021 1901   BUN 25 (H) 10/16/2021 1901   CREATININE 1.42 (H) 10/16/2021 1901   CALCIUM 9.3 10/16/2021 1901   GFRNONAA 44 (L) 10/16/2021 1901   No results found for: "HGBA1C" No results found for: "INSULIN" CBC    Component Value Date/Time   WBC 8.9 10/16/2021 1901   RBC 4.45 10/16/2021 1901   HGB 13.0 10/16/2021 1901   HCT 38.7 10/16/2021 1901   PLT 318 10/16/2021 1901   MCV 87.0 10/16/2021 1901   MCH 29.2 10/16/2021 1901   MCHC 33.6 10/16/2021 1901   RDW 13.7 10/16/2021 1901   Iron/TIBC/Ferritin/ %Sat No results found for: "IRON", "TIBC", "FERRITIN", "IRONPCTSAT" Lipid Panel  No results found for: "CHOL", "TRIG", "HDL", "CHOLHDL", "VLDL", "LDLCALC", "LDLDIRECT" Hepatic Function Panel     Component Value Date/Time   PROT 7.4 10/16/2021 1901   ALBUMIN 4.3 10/16/2021 1901   AST 18 10/16/2021 1901   ALT 28 10/16/2021 1901   ALKPHOS 97 10/16/2021 1901   BILITOT 0.5 10/16/2021 1901   No results found for: "TSH"   Assessment and Plan:   Prediabetes Assessment & Plan: She reports a recent diagnosis of prediabetes and a fam hx of type II diabetes.  She does consume excess starches/ sweets with no regular exercise.  She would like to see A1c reduction with weight loss.  She recently started metformin 500 mg once daily with food with some loose stools.  Continue metformin per PCP. She agrees to bring in a copy of her most recent labs Begin cutting out SSBs   Generalized obesity  Primary hypertension Assessment & Plan: BP is at the ULN on Amlodopine 5 mg daily and Lisinopril 20 mg daily.  She is tolerating both medications well and hopes to see improvements in her BP with weight loss.  Continue current BP medications. Begin active plan for weight reduction   Vitamin D deficiency Assessment & Plan: She was  recently started on a vitamin D supplement but did not bring in her dose today.  She c/o fatigue.  She had labs done recently at her PCP which I do not have access to.  Bring in a copy of labs to next visit. We discussed the importance of vitamin D with immune function, energy level and leptin resistance.    BMI 33.0-33.9,adult        Obesity Treatment / Action Plan:  Patient will work on garnering support from family and friends to begin weight loss journey. Will work on eliminating or reducing the presence of highly palatable, calorie dense foods in the home. Will complete provided nutritional and psychosocial assessment questionnaire before the next appointment. Will be scheduled for indirect calorimetry to determine resting energy expenditure in a fasting state.  This will allow [place] to create a reduced calorie, high-protein meal plan to promote loss of fat mass while preserving muscle mass. Will think about ideas on how to incorporate physical activity into their daily routine. Was counseled on nutritional approaches to weight loss and benefits of complex carbs and high quality protein as part of nutritional weight management. Was counseled on pharmacotherapy and role as an adjunct in weight management.   Obesity Education Performed Today:  She was weighed on the bioimpedance scale and results were discussed and documented in the synopsis.  We discussed obesity as a disease and the importance of a more detailed evaluation of all the factors contributing to the disease.  We discussed the importance of long term lifestyle changes which include nutrition, exercise and behavioral modifications as well as the importance of customizing this to her specific health and social needs.  We discussed the benefits of reaching a healthier weight to alleviate the symptoms of existing conditions and reduce the risks of the biomechanical, metabolic and psychological effects of obesity.  Tracy Reilly appears to be in the action stage of change and states they are ready to start intensive lifestyle modifications and behavioral modifications.  30 minutes was spent today on this visit including the above counseling, pre-visit chart review, and post-visit documentation.  Reviewed by clinician on day of visit: allergies, medications, problem list, medical history, surgical history, family history, social history, and previous encounter notes pertinent to obesity diagnosis.    Seymour Bars, D.O. DABFM, DABOM [facility] [phone] [place], [place] [address] [phone]

## 2023-03-02 NOTE — Assessment & Plan Note (Signed)
She was recently started on a vitamin D supplement but did not bring in her dose today.  She c/o fatigue.  She had labs done recently at her PCP which I do not have access to.  Bring in a copy of labs to next visit. We discussed the importance of vitamin D with immune function, energy level and leptin resistance.

## 2023-03-16 DIAGNOSIS — L578 Other skin changes due to chronic exposure to nonionizing radiation: Secondary | ICD-10-CM | POA: Diagnosis not present

## 2023-03-16 DIAGNOSIS — D4989 Neoplasm of unspecified behavior of other specified sites: Secondary | ICD-10-CM | POA: Diagnosis not present

## 2023-03-16 DIAGNOSIS — L2084 Intrinsic (allergic) eczema: Secondary | ICD-10-CM | POA: Diagnosis not present

## 2023-03-16 DIAGNOSIS — L570 Actinic keratosis: Secondary | ICD-10-CM | POA: Diagnosis not present

## 2023-03-16 DIAGNOSIS — D485 Neoplasm of uncertain behavior of skin: Secondary | ICD-10-CM | POA: Diagnosis not present

## 2023-03-16 DIAGNOSIS — L821 Other seborrheic keratosis: Secondary | ICD-10-CM | POA: Diagnosis not present

## 2023-03-16 DIAGNOSIS — D225 Melanocytic nevi of trunk: Secondary | ICD-10-CM | POA: Diagnosis not present

## 2023-03-16 DIAGNOSIS — D2272 Melanocytic nevi of left lower limb, including hip: Secondary | ICD-10-CM | POA: Diagnosis not present

## 2023-03-18 DIAGNOSIS — M064 Inflammatory polyarthropathy: Secondary | ICD-10-CM | POA: Diagnosis not present

## 2023-03-18 DIAGNOSIS — L2089 Other atopic dermatitis: Secondary | ICD-10-CM | POA: Diagnosis not present

## 2023-03-31 ENCOUNTER — Ambulatory Visit (INDEPENDENT_AMBULATORY_CARE_PROVIDER_SITE_OTHER): Payer: BC Managed Care – PPO | Admitting: Family Medicine

## 2023-03-31 ENCOUNTER — Encounter (INDEPENDENT_AMBULATORY_CARE_PROVIDER_SITE_OTHER): Payer: Self-pay | Admitting: Family Medicine

## 2023-03-31 VITALS — BP 131/83 | HR 63 | Temp 97.7°F | Ht 66.5 in | Wt 212.0 lb

## 2023-03-31 DIAGNOSIS — K760 Fatty (change of) liver, not elsewhere classified: Secondary | ICD-10-CM

## 2023-03-31 DIAGNOSIS — E7849 Other hyperlipidemia: Secondary | ICD-10-CM

## 2023-03-31 DIAGNOSIS — E7800 Pure hypercholesterolemia, unspecified: Secondary | ICD-10-CM | POA: Insufficient documentation

## 2023-03-31 DIAGNOSIS — Z1331 Encounter for screening for depression: Secondary | ICD-10-CM

## 2023-03-31 DIAGNOSIS — R5383 Other fatigue: Secondary | ICD-10-CM | POA: Diagnosis not present

## 2023-03-31 DIAGNOSIS — I10 Essential (primary) hypertension: Secondary | ICD-10-CM | POA: Diagnosis not present

## 2023-03-31 DIAGNOSIS — R7303 Prediabetes: Secondary | ICD-10-CM

## 2023-03-31 DIAGNOSIS — R0602 Shortness of breath: Secondary | ICD-10-CM | POA: Diagnosis not present

## 2023-03-31 DIAGNOSIS — E669 Obesity, unspecified: Secondary | ICD-10-CM

## 2023-03-31 DIAGNOSIS — Z6833 Body mass index (BMI) 33.0-33.9, adult: Secondary | ICD-10-CM | POA: Insufficient documentation

## 2023-03-31 DIAGNOSIS — E559 Vitamin D deficiency, unspecified: Secondary | ICD-10-CM

## 2023-04-01 LAB — COMPREHENSIVE METABOLIC PANEL
ALT: 26 IU/L (ref 0–32)
AST: 17 IU/L (ref 0–40)
Albumin/Globulin Ratio: 2 (ref 1.2–2.2)
Albumin: 4.8 g/dL (ref 3.8–4.9)
Alkaline Phosphatase: 83 IU/L (ref 44–121)
BUN/Creatinine Ratio: 37 — ABNORMAL HIGH (ref 9–23)
BUN: 25 mg/dL — ABNORMAL HIGH (ref 6–24)
Bilirubin Total: 0.3 mg/dL (ref 0.0–1.2)
CO2: 21 mmol/L (ref 20–29)
Calcium: 9.8 mg/dL (ref 8.7–10.2)
Chloride: 101 mmol/L (ref 96–106)
Creatinine, Ser: 0.67 mg/dL (ref 0.57–1.00)
Globulin, Total: 2.4 g/dL (ref 1.5–4.5)
Glucose: 86 mg/dL (ref 70–99)
Potassium: 4.2 mmol/L (ref 3.5–5.2)
Sodium: 139 mmol/L (ref 134–144)
Total Protein: 7.2 g/dL (ref 6.0–8.5)
eGFR: 103 mL/min/{1.73_m2} (ref >59)

## 2023-04-01 LAB — INSULIN, RANDOM: INSULIN: 19.6 u[IU]/mL (ref 2.6–24.9)

## 2023-04-01 LAB — VITAMIN B12: Vitamin B-12: 393 pg/mL (ref 232–1245)

## 2023-04-01 LAB — TSH RFX ON ABNORMAL TO FREE T4: TSH: 1.51 u[IU]/mL (ref 0.450–4.500)

## 2023-04-01 LAB — HEMOGLOBIN A1C
Est. average glucose Bld gHb Est-mCnc: 123 mg/dL
Hgb A1c MFr Bld: 5.9 % — ABNORMAL HIGH (ref 4.8–5.6)

## 2023-04-01 LAB — VITAMIN D 25 HYDROXY (VIT D DEFICIENCY, FRACTURES): Vit D, 25-Hydroxy: 33.3 ng/mL (ref 30.0–100.0)

## 2023-04-01 LAB — FOLATE: Folate: 8.9 ng/mL (ref >3.0)

## 2023-04-05 DIAGNOSIS — H00021 Hordeolum internum right upper eyelid: Secondary | ICD-10-CM | POA: Diagnosis not present

## 2023-04-06 NOTE — Progress Notes (Signed)
Chief Complaint:   OBESITY Tracy Reilly (MR# [id number]) is a 57 y.o. female who presents for evaluation and treatment of obesity and related comorbidities. Current BMI is Body mass index is 33.71 kg/m. Tracy Reilly has been struggling with her weight for many years and has been unsuccessful in either losing weight, maintaining weight loss, or reaching her healthy weight goal.  Works sedentary job as a Wellsite geologist.  She is married to her husband Tracy Reilly.  She would like to get 160 lb. She does not exercise, overeats at meals, stress and she is a boredom eater.  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 She has tried Xenical.    Tracy Reilly is currently in the action stage of change and ready to dedicate time achieving and maintaining a healthier weight. Tracy Reilly is interested in becoming our patient and working on intensive lifestyle modifications including (but not limited to) diet and exercise for weight loss.  Tracy Reilly's habits were reviewed today and are as follows: Her family eats meals together, she thinks her family will eat healthier with her, her desired weight loss is 62 lbs, she started gaining weight after having her children, her heaviest weight ever was 219 pounds, she is a picky eater and doesn't like to eat healthier foods, she has significant food cravings issues, she snacks frequently in the evenings, she is frequently drinking liquids with calories, she frequently makes poor food choices, she frequently eats larger portions than normal, and she struggles with emotional eating.  Depression Screen Tracy Reilly's Food and Mood (modified PHQ-9) score was 8.  Subjective:   1. Other fatigue Tracy Reilly admits to daytime somnolence and admits to waking up still tired. Patient has a history of symptoms of daytime fatigue and morning fatigue. Tracy Reilly generally gets 6 or 7 hours of sleep per night, and states that she has nightime awakenings. Snoring is not present.  Apneic episodes are not present. Epworth Sleepiness Score is 3. EKG, NSR without ischemia.   2. SOBOE (shortness of breath on exertion) Tracy Reilly notes increasing shortness of breath with exercising and seems to be worsening over time with weight gain. She notes getting out of breath sooner with activity than she used to. This has not gotten worse recently. Tracy Reilly denies shortness of breath at rest or orthopnea.  3. Essential hypertension Blood pressure is well controlled on amlodipine 5 mg daily and lisinopril 20 mg daily.  Patient denies chest pain or headaches, leg edema. She has a positive family history of hypertension.  She was diagnosed 22 years ago.   4. Other hyperlipidemia Last LDL 178. 11/25/2022.  Patient is not on any lipid lowering medications.   5. Prediabetes Patient is on metformin 500 mg daily, started on  11/25/2022. Patient denies consumption of sweets.  She denies GDM.  She has positive family history of T2DM. Patient tends to over consume starches.   6. Vitamin D deficiency Patient is taking OTC Vitamin D 1,000 IU daily.  Started 3 months ago.   7. NAFLD (nonalcoholic fatty liver disease) Found on ultrasound on 09/18/2021, reviewed today.  She rarely drinks ETOH.   Assessment/Plan:   1. Other fatigue Tracy Reilly does feel that her weight is causing her energy to be lower than it should be. Fatigue may be related to obesity, depression or many other causes. Labs will be ordered, and in the meanwhile, Tracy Reilly will focus on self care including making healthy food choices, increasing physical activity and focusing  on stress reduction.  - EKG 12-Lead - Folate - Comprehensive metabolic panel - Vitamin B12 - TSH Rfx on Abnormal to Free T4  2. SOBOE (shortness of breath on exertion) Tracy Reilly does feel that she gets out of breath more easily that she used to when she exercises. Tracy Reilly's shortness of breath appears to be obesity related and exercise induced. She has agreed to work on  weight loss and gradually increase exercise to treat her exercise induced shortness of breath. Will continue to monitor closely.  3. Essential hypertension Continue current blood pressure medications.   4. Other hyperlipidemia Begin a low saturated fat diet. Recheck  in 3-4 months.   5. Prediabetes Begin a lower sugar diet limiting starches and refined carbs and in more regular physical activity. Check labs today.   - Hemoglobin A1c - Insulin, random  6. Vitamin D deficiency Check Vitamin D level today.   - VITAMIN D 25 Hydroxy (Vit-D Deficiency, Fractures)  7. NAFLD (nonalcoholic fatty liver disease) Begin active plan for weight loss.   8. Depression screen Tracy Reilly had a positive depression screening. Depression is commonly associated with obesity and often results in emotional eating behaviors. We will monitor this closely and work on CBT to help improve the non-hunger eating patterns. Referral to Psychology may be required if no improvement is seen as she continues in our clinic.  9. BMI 33.0-33.9,adult  10. Obesity, with starting BMI 33.8 Tracy Reilly is currently in the action stage of change and her goal is to continue with weight loss efforts. I recommend Tracy Reilly begin the structured treatment plan as follows:  She has agreed to the Category 2 Plan. 100 calorie snack handout given. Eating out guide given today.   Exercise goals: All adults should avoid inactivity. Some physical activity is better than none, and adults who participate in any amount of physical activity gain some health benefits.   Behavioral modification strategies: increasing lean protein intake, increasing vegetables, increasing water intake, decreasing eating out, no skipping meals, meal planning and cooking strategies, keeping healthy foods in the home, better snacking choices, and decreasing junk food.  She was informed of the importance of frequent follow-up visits to maximize her success with intensive  lifestyle modifications for her multiple health conditions. She was informed we would discuss her lab results at her next visit unless there is a critical issue that needs to be addressed sooner. Tracy Reilly agreed to keep her next visit at the agreed upon time to discuss these results.  Objective:   Blood pressure 131/83, pulse 63, temperature 97.7 F (36.5 C), height 5' 6.5" (1.689 m), weight 212 lb (96.2 kg), SpO2 98 %. Body mass index is 33.71 kg/m.  EKG: Normal sinus rhythm, rate 71 bpm.  Indirect Calorimeter completed today shows a VO2 of 223 and a REE of 1541.  Her calculated basal metabolic rate is 1610 thus her basal metabolic rate is worse than expected.  General: Cooperative, alert, well developed, in no acute distress. HEENT: Conjunctivae and lids unremarkable. Cardiovascular: Regular rhythm.  Lungs: Normal work of breathing. Neurologic: No focal deficits.   Lab Results  Component Value Date   CREATININE 0.67 03/31/2023   BUN 25 (H) 03/31/2023   NA 139 03/31/2023   K 4.2 03/31/2023   CL 101 03/31/2023   CO2 21 03/31/2023   Lab Results  Component Value Date   ALT 26 03/31/2023   AST 17 03/31/2023   ALKPHOS 83 03/31/2023   BILITOT 0.3 03/31/2023   Lab Results  Component Value Date   HGBA1C 5.9 (H) 03/31/2023   Lab Results  Component Value Date   INSULIN 19.6 03/31/2023   Lab Results  Component Value Date   TSH 1.510 03/31/2023   No results found for: "CHOL", "HDL", "LDLCALC", "LDLDIRECT", "TRIG", "CHOLHDL" Lab Results  Component Value Date   WBC 8.9 10/16/2021   HGB 13.0 10/16/2021   HCT 38.7 10/16/2021   MCV 87.0 10/16/2021   PLT 318 10/16/2021   No results found for: "IRON", "TIBC", "FERRITIN"  Attestation Statements:   Reviewed by clinician on day of visit: allergies, medications, problem list, medical history, surgical history, family history, social history, and previous encounter notes.   Time spent on visit including pre-visit chart review and  post-visit charting and care was 40 minutes.   I, Malcolm Metro, am acting as Energy manager for Seymour Bars, DO.  I have reviewed the above documentation for accuracy and completeness, and I agree with the above. Seymour Bars DO

## 2023-04-08 DIAGNOSIS — R079 Chest pain, unspecified: Secondary | ICD-10-CM | POA: Diagnosis not present

## 2023-04-08 DIAGNOSIS — R109 Unspecified abdominal pain: Secondary | ICD-10-CM | POA: Diagnosis not present

## 2023-04-08 DIAGNOSIS — R55 Syncope and collapse: Secondary | ICD-10-CM | POA: Diagnosis not present

## 2023-04-14 ENCOUNTER — Encounter (INDEPENDENT_AMBULATORY_CARE_PROVIDER_SITE_OTHER): Payer: Self-pay | Admitting: Family Medicine

## 2023-04-14 ENCOUNTER — Ambulatory Visit (INDEPENDENT_AMBULATORY_CARE_PROVIDER_SITE_OTHER): Payer: BC Managed Care – PPO | Admitting: Family Medicine

## 2023-04-14 VITALS — BP 125/79 | HR 60 | Temp 97.7°F | Ht 66.5 in | Wt 206.0 lb

## 2023-04-14 DIAGNOSIS — K760 Fatty (change of) liver, not elsewhere classified: Secondary | ICD-10-CM

## 2023-04-14 DIAGNOSIS — E669 Obesity, unspecified: Secondary | ICD-10-CM | POA: Diagnosis not present

## 2023-04-14 DIAGNOSIS — R7303 Prediabetes: Secondary | ICD-10-CM

## 2023-04-14 DIAGNOSIS — E88819 Insulin resistance, unspecified: Secondary | ICD-10-CM | POA: Insufficient documentation

## 2023-04-14 DIAGNOSIS — E559 Vitamin D deficiency, unspecified: Secondary | ICD-10-CM | POA: Diagnosis not present

## 2023-04-14 DIAGNOSIS — Z6832 Body mass index (BMI) 32.0-32.9, adult: Secondary | ICD-10-CM

## 2023-04-14 MED ORDER — METFORMIN HCL ER 750 MG PO TB24: 750.0000 mg | ORAL_TABLET | Freq: Every day | ORAL | 0 refills | Status: DC

## 2023-04-14 NOTE — Progress Notes (Signed)
Office: [phone]  /  Fax: [phone]  WEIGHT SUMMARY AND BIOMETRICS  Starting Date: 03/31/23  Starting Weight: 212lb   Weight Lost Since Last Visit: 6lb   Vitals Temp: 97.7 F (36.5 C) BP: 125/79 Pulse Rate: 60 SpO2: 100 %   Body Composition  Body Fat %: 42.6 % Fat Mass (lbs): 88 lbs Muscle Mass (lbs): 112.4 lbs Total Body Water (lbs): 78.2 lbs Visceral Fat Rating : 11     HPI  Chief Complaint: OBESITY  Tracy Reilly is here to discuss her progress with her obesity treatment plan. She is on the the Category 2 Plan and states she is following her eating plan approximately 80 % of the time. She states she is exercising 60 minutes 4 times per week.   Interval History:  Since last office visit she is down 6 lb She lost 1.6 lb of muscle mass and is down 4.8  lb of body fat in 2 weeks Her husband is supportive She is eating most of the food on her plan She has had to travel and is making better choices She is doing some yard work and plans to walk outdoors more Hunger and cravings have reduced  Pharmacotherapy: on metformin 500 mg once daily for prediabetes  PHYSICAL EXAM:  Blood pressure 125/79, pulse 60, temperature 97.7 F (36.5 C), height 5' 6.5" (1.689 m), weight 206 lb (93.4 kg), SpO2 100 %. Body mass index is 32.75 kg/m.  General: She is overweight, cooperative, alert, well developed, and in no acute distress. PSYCH: Has normal mood, affect and thought process.   Lungs: Normal breathing effort, no conversational dyspnea.   ASSESSMENT AND PLAN  TREATMENT PLAN FOR OBESITY:  Recommended Dietary Goals  Maat is currently in the action stage of change. As such, her goal is to continue weight management plan. She has agreed to the Category 2 Plan.  Behavioral Intervention  We discussed the following Behavioral Modification Strategies today: increasing lean protein intake, decreasing simple carbohydrates , increasing vegetables, increasing lower  glycemic fruits, avoiding skipping meals, increasing water intake, keeping healthy foods at home, continue to work on implementation of reduced calorie nutritional plan, continue to practice mindfulness when eating, and planning for success.  Additional resources provided today: NA  Recommended Physical Activity Goals  Adhya has been advised to work up to 150 minutes of moderate intensity aerobic activity a week and strengthening exercises 2-3 times per week for cardiovascular health, weight loss maintenance and preservation of muscle mass.   She has agreed to Start aerobic activity with a goal of 150 minutes a week at moderate intensity.   Pharmacotherapy changes for the treatment of obesity: Changed metformin to XR 750 once daily with food  ASSOCIATED CONDITIONS ADDRESSED TODAY  Prediabetes Assessment & Plan: Lab Results  Component Value Date   HGBA1C 5.9 (H) 03/31/2023   Reviewed labs from last visit.  Her A1c was in the prediabetic range at 5.9.  She had previously been diagnosed with prediabetes and has been taking metformin 500 mg once daily since January.  She reports some loose stools and abdominal pain from metformin.  She is taking this at bedtime.  She is actively working on reducing her intake of added sugar and refined carbohydrates.  She has been more physically active.  She has started to see weight reduction.  Continue working on her prescribed dietary plan.  We discussed some low sugar/high-protein snack choices.  Plan to increase walking frequency to 30 minutes at least 3 days a  week.  Change metformin 500 mg once daily to metformin XR 750 mg once daily with dinner.  Orders: -     metFORMIN HCl ER; Take 1 tablet (750 mg total) by mouth daily with breakfast.  Dispense: 30 tablet; Refill: 0  Generalized obesity with starting BMI 33  BMI 32.0-32.9,adult  Insulin resistance Assessment & Plan: Reviewed labs from last visit.  Fasting insulin elevated at 19.6.  She is  currently on metformin 500 mg once daily with food with some mild abdominal discomfort.  She has seen a reduction in carb and sugar cravings with her current dietary plan.  She does plan to ramp up her walking time.  Will change her metformin to metformin XR 750 mg once daily, looking for improvements in fasting insulin levels in the next 3 to 4 months.  Continue current lifestyle change goals.   Vitamin D deficiency Assessment & Plan: Last vitamin D Lab Results  Component Value Date   VD25OH 33.3 03/31/2023   Reviewed lab from last visit.  She has been taking a calcium and vitamin D supplement daily and additional vitamin D 1000 IU once daily.  She does have some complaints of fatigue.  We discussed vitamin D is importance with immune function, bone health, energy levels and leptin resistance.  She agrees to continuing her calcium and vitamin D supplement but changing her over-the-counter vitamin D to 2000 IU once daily.  Recheck level in the next 3 to 4 months.   NAFLD (nonalcoholic fatty liver disease) Assessment & Plan: Reviewed lab from last visit.  Her liver enzymes were normal on her chemistry panel.  She is actively working on weight loss.  Will aim for a visceral fat rating under 10.       She was informed of the importance of frequent follow up visits to maximize her success with intensive lifestyle modifications for her multiple health conditions.   ATTESTASTION STATEMENTS:  Reviewed by clinician on day of visit: allergies, medications, problem list, medical history, surgical history, family history, social history, and previous encounter notes pertinent to obesity diagnosis.   I have personally spent 30 minutes total time today in preparation, patient care, nutritional counseling and documentation for this visit, including the following: review of clinical lab tests; review of medical tests/procedures/services.      [name], DO DABFM, DABOM [facility] [address] [place], [place] [address] [phone]

## 2023-04-14 NOTE — Assessment & Plan Note (Signed)
Reviewed labs from last visit.  Fasting insulin elevated at 19.6.  She is currently on metformin 500 mg once daily with food with some mild abdominal discomfort.  She has seen a reduction in carb and sugar cravings with her current dietary plan.  She does plan to ramp up her walking time.  Will change her metformin to metformin XR 750 mg once daily, looking for improvements in fasting insulin levels in the next 3 to 4 months.  Continue current lifestyle change goals.

## 2023-04-14 NOTE — Assessment & Plan Note (Signed)
Last vitamin D Lab Results  Component Value Date   VD25OH 33.3 03/31/2023   Reviewed lab from last visit.  She has been taking a calcium and vitamin D supplement daily and additional vitamin D 1000 IU once daily.  She does have some complaints of fatigue.  We discussed vitamin D is importance with immune function, bone health, energy levels and leptin resistance.  She agrees to continuing her calcium and vitamin D supplement but changing her over-the-counter vitamin D to 2000 IU once daily.  Recheck level in the next 3 to 4 months.

## 2023-04-14 NOTE — Assessment & Plan Note (Signed)
Lab Results  Component Value Date   HGBA1C 5.9 (H) 03/31/2023   Reviewed labs from last visit.  Her A1c was in the prediabetic range at 5.9.  She had previously been diagnosed with prediabetes and has been taking metformin 500 mg once daily since January.  She reports some loose stools and abdominal pain from metformin.  She is taking this at bedtime.  She is actively working on reducing her intake of added sugar and refined carbohydrates.  She has been more physically active.  She has started to see weight reduction.  Continue working on her prescribed dietary plan.  We discussed some low sugar/high-protein snack choices.  Plan to increase walking frequency to 30 minutes at least 3 days a week.  Change metformin 500 mg once daily to metformin XR 750 mg once daily with dinner.

## 2023-04-14 NOTE — Assessment & Plan Note (Signed)
Reviewed lab from last visit.  Her liver enzymes were normal on her chemistry panel.  She is actively working on weight loss.  Will aim for a visceral fat rating under 10.

## 2023-04-29 ENCOUNTER — Encounter (INDEPENDENT_AMBULATORY_CARE_PROVIDER_SITE_OTHER): Payer: Self-pay | Admitting: Family Medicine

## 2023-04-29 ENCOUNTER — Encounter (INDEPENDENT_AMBULATORY_CARE_PROVIDER_SITE_OTHER): Payer: Self-pay

## 2023-04-29 ENCOUNTER — Ambulatory Visit (INDEPENDENT_AMBULATORY_CARE_PROVIDER_SITE_OTHER): Payer: BC Managed Care – PPO | Admitting: Family Medicine

## 2023-04-29 VITALS — BP 122/78 | HR 60 | Temp 98.7°F | Ht 66.5 in | Wt 204.0 lb

## 2023-04-29 DIAGNOSIS — Z6832 Body mass index (BMI) 32.0-32.9, adult: Secondary | ICD-10-CM | POA: Diagnosis not present

## 2023-04-29 DIAGNOSIS — R7303 Prediabetes: Secondary | ICD-10-CM

## 2023-04-29 DIAGNOSIS — E669 Obesity, unspecified: Secondary | ICD-10-CM

## 2023-04-29 DIAGNOSIS — E559 Vitamin D deficiency, unspecified: Secondary | ICD-10-CM

## 2023-04-29 DIAGNOSIS — E88819 Insulin resistance, unspecified: Secondary | ICD-10-CM

## 2023-04-29 MED ORDER — METFORMIN HCL ER 750 MG PO TB24: 750.0000 mg | ORAL_TABLET | Freq: Every day | ORAL | 0 refills | Status: DC

## 2023-04-29 NOTE — Assessment & Plan Note (Addendum)
Lab Results  Component Value Date   HGBA1C 5.9 (H) 03/31/2023   She has tolerated dose increase of metformin from 500 mg once daily to XR 750 mg once daily with food without adverse SE She is actively working on weight reduction, down 8 lb in 1 month along with a reduced calorie low sugar diet  Plan: continue metformin XR 750 mg once daily with food Recheck BMP, B12, A1c, fasting insulin in 2 mos Ramp up walking time to 20 min daily

## 2023-04-29 NOTE — Assessment & Plan Note (Signed)
Last vitamin D Lab Results  Component Value Date   VD25OH 33.3 03/31/2023   She had vitamin D 2000 IU once daily in addition to her calcium and vitamin D daily supplement.  Her energy level is improving.  She denies adverse side effects.  Recheck vitamin D levels in the next 6 months.

## 2023-04-29 NOTE — Progress Notes (Signed)
Office: [phone]  /  Fax: [phone]  WEIGHT SUMMARY AND BIOMETRICS  Starting Date: 03/31/23  Starting Weight: 212lb   Weight Lost Since Last Visit: 2lb   Vitals Temp: 98.7 F (37.1 C) BP: 122/78 Pulse Rate: 60 SpO2: 100 %   Body Composition  Body Fat %: 42.1 % Fat Mass (lbs): 86 lbs Muscle Mass (lbs): 112.2 lbs Total Body Water (lbs): 76.8 lbs Visceral Fat Rating : 11   HPI  Chief Complaint: OBESITY  Tracy Reilly is here to discuss her progress with her obesity treatment plan. She is on the the Category 2 Plan and states she is following her eating plan approximately 85 % of the time. She states she is doing some outside work.    Interval History:  Since last office visit she is down 2 lb She has maintained muscle mass She is getting tired of eggs in the morning She is bringing lunches to work and is making good food choices when eating out She celebrated her granddaughter's birthday Denies cravings for sweets.  Keeping chips out of the house Has a good support system Drinking more water She is walking or doing yardwork in the evenings She tried out the [place] protein pasta  Reviewed alternative breakfast options  Pharmacotherapy: metformin XR 750 mg daily with food, tolerating well  PHYSICAL EXAM:  Blood pressure 122/78, pulse 60, temperature 98.7 F (37.1 C), height 5' 6.5" (1.689 m), weight 204 lb (92.5 kg), SpO2 100 %. Body mass index is 32.43 kg/m.  General: She is overweight, cooperative, alert, well developed, and in no acute distress. PSYCH: Has normal mood, affect and thought process.   Lungs: Normal breathing effort, no conversational dyspnea.   ASSESSMENT AND PLAN  TREATMENT PLAN FOR OBESITY:  Recommended Dietary Goals  Tracy Reilly is currently in the action stage of change. As such, her goal is to continue weight management plan. She has agreed to the Category 2 Plan. -Additional breakfast options include to [place] sausage patties and  one 100-calorie [place] English muffin or 1 carton of low sugar [place] yogurt +1 handful frozen fruit +1 teaspoon of Chia seed or flaxseed to make [employer] Intervention  We discussed the following Behavioral Modification Strategies today: increasing lean protein intake, decreasing simple carbohydrates , increasing vegetables, increasing lower glycemic fruits, increasing fiber rich foods, avoiding skipping meals, increasing water intake, work on meal planning and preparation, continue to practice mindfulness when eating, and planning for success.  Additional resources provided today: NA  Recommended Physical Activity Goals  Tracy Reilly has been advised to work up to 150 minutes of moderate intensity aerobic activity a week and strengthening exercises 2-3 times per week for cardiovascular health, weight loss maintenance and preservation of muscle mass.   She has agreed to Start aerobic activity with a goal of 150 minutes a week at moderate intensity.   Pharmacotherapy changes for the treatment of obesity: None  ASSOCIATED CONDITIONS ADDRESSED TODAY  Prediabetes Assessment & Plan: Lab Results  Component Value Date   HGBA1C 5.9 (H) 03/31/2023   She has tolerated dose increase of metformin from 500 mg once daily to XR 750 mg once daily with food without adverse SE She is actively working on weight reduction, down 8 lb in 1 month along with a reduced calorie low sugar diet  Plan: continue metformin XR 750 mg once daily with food Recheck BMP, B12, A1c, fasting insulin in 2 mos Ramp up walking time to 20 min daily  Orders: -  metFORMIN HCl ER; Take 1 tablet (750 mg total) by mouth daily with breakfast.  Dispense: 30 tablet; Refill: 0  Generalized obesity with starting BMI 33  BMI 32.0-32.9,adult  Vitamin D deficiency Assessment & Plan: Last vitamin D Lab Results  Component Value Date   VD25OH 33.3 03/31/2023   She had vitamin D 2000 IU once daily in addition to her  calcium and vitamin D daily supplement.  Her energy level is improving.  She denies adverse side effects.  Recheck vitamin D levels in the next 6 months.   Insulin resistance Assessment & Plan: Last fasting insulin elevated at 19.6 5/29.  Her Homa IR score is 4.2 consistent with severe insulin resistance.  She has been working on reducing her intake of starches and sweets.  She has been more physically active though has no formal exercise.  She has lost 3.7% total body weight loss in the past 1 month of medically supervised weight management.  Continue to work on a low sugar/low starch diet rich in lean protein and nonstarchy vegetables.  Recommend increasing walking time, tracking of daily steps ensuring least 20 minutes of walking daily.       She was informed of the importance of frequent follow up visits to maximize her success with intensive lifestyle modifications for her multiple health conditions.   ATTESTASTION STATEMENTS:  Reviewed by clinician on day of visit: allergies, medications, problem list, medical history, surgical history, family history, social history, and previous encounter notes pertinent to obesity diagnosis.   I have personally spent 30 minutes total time today in preparation, patient care, nutritional counseling and documentation for this visit, including the following: review of clinical lab tests; review of medical tests/procedures/services.      [name], DO DABFM, DABOM [facility] [address] [place], [place] [address] [phone]

## 2023-04-29 NOTE — Assessment & Plan Note (Signed)
Last fasting insulin elevated at 19.6 5/29.  Her Homa IR score is 4.2 consistent with severe insulin resistance.  She has been working on reducing her intake of starches and sweets.  She has been more physically active though has no formal exercise.  She has lost 3.7% total body weight loss in the past 1 month of medically supervised weight management.  Continue to work on a low sugar/low starch diet rich in lean protein and nonstarchy vegetables.  Recommend increasing walking time, tracking of daily steps ensuring least 20 minutes of walking daily.

## 2023-05-02 MED ORDER — METFORMIN HCL ER 750 MG PO TB24: 750.0000 mg | ORAL_TABLET | Freq: Every day | ORAL | 0 refills | Status: DC

## 2023-05-11 DIAGNOSIS — H25813 Combined forms of age-related cataract, bilateral: Secondary | ICD-10-CM | POA: Diagnosis not present

## 2023-05-11 DIAGNOSIS — H16223 Keratoconjunctivitis sicca, not specified as Sjogren's, bilateral: Secondary | ICD-10-CM | POA: Diagnosis not present

## 2023-05-11 DIAGNOSIS — E119 Type 2 diabetes mellitus without complications: Secondary | ICD-10-CM | POA: Diagnosis not present

## 2023-05-11 DIAGNOSIS — H52223 Regular astigmatism, bilateral: Secondary | ICD-10-CM | POA: Diagnosis not present

## 2023-05-11 DIAGNOSIS — H00021 Hordeolum internum right upper eyelid: Secondary | ICD-10-CM | POA: Diagnosis not present

## 2023-05-13 ENCOUNTER — Ambulatory Visit (INDEPENDENT_AMBULATORY_CARE_PROVIDER_SITE_OTHER): Payer: BC Managed Care – PPO | Admitting: Family Medicine

## 2023-05-13 ENCOUNTER — Telehealth (INDEPENDENT_AMBULATORY_CARE_PROVIDER_SITE_OTHER): Payer: Self-pay | Admitting: Family Medicine

## 2023-05-13 ENCOUNTER — Encounter (INDEPENDENT_AMBULATORY_CARE_PROVIDER_SITE_OTHER): Payer: Self-pay | Admitting: Family Medicine

## 2023-05-13 VITALS — BP 137/81 | HR 71 | Temp 97.9°F | Ht 66.5 in | Wt 204.0 lb

## 2023-05-13 DIAGNOSIS — Z6832 Body mass index (BMI) 32.0-32.9, adult: Secondary | ICD-10-CM

## 2023-05-13 DIAGNOSIS — E669 Obesity, unspecified: Secondary | ICD-10-CM

## 2023-05-13 DIAGNOSIS — R7303 Prediabetes: Secondary | ICD-10-CM

## 2023-05-13 DIAGNOSIS — R632 Polyphagia: Secondary | ICD-10-CM

## 2023-05-13 DIAGNOSIS — E559 Vitamin D deficiency, unspecified: Secondary | ICD-10-CM | POA: Diagnosis not present

## 2023-05-13 MED ORDER — TOPIRAMATE ER 25 MG PO CAP24: 25.0000 mg | ORAL_CAPSULE | Freq: Every day | ORAL | 0 refills | Status: DC

## 2023-05-13 NOTE — Telephone Encounter (Signed)
Prior authorization done via cover my meds for patients Topiramate ER. Per patients insurance no PA needed.  Outcome Additional Information Required This medication or product is on your plan's list of covered drugs. Prior authorization is not required at this time. If your pharmacy has questions regarding the processing of your prescription, please have them call the pharmacy help desk at [phone]. **Please note: This request was submitted electronically. Formulary lowering, tiering exception, cost reduction and/or pre-benefit determination review (including prospective Medicare hospice reviews) requests cannot be requested using this method of submission. Providers contact us at [phone] for further assistance.

## 2023-05-13 NOTE — Assessment & Plan Note (Signed)
She did increase metformin XR to 750 mg daily but has had problems with gas pain even with the 500 mg dose. She has been constipated even with a daily fiber supplement, increased water intake and fruits and veggies intake daily. She avoids high sugar foods and drinks and has limited starch intake to her meal plan.  Did not see much improvement in cravings since starting metformin.  Lab Results  Component Value Date   HGBA1C 5.9 (H) 03/31/2023    Discontinue metformin due to GI side effects Continue a low sugar/ low starch diet, weight loss and regular exercise.

## 2023-05-13 NOTE — Progress Notes (Signed)
Office: [phone]  /  Fax: [phone]  WEIGHT SUMMARY AND BIOMETRICS  Starting Date: 03/31/23  Starting Weight: 212lb   Weight Lost Since Last Visit: 0lb   Vitals Temp: 97.9 F (36.6 C) BP: 137/81 Pulse Rate: 71 SpO2: 100 %   Body Composition  Body Fat %: 42.6 % Fat Mass (lbs): 87 lbs Muscle Mass (lbs): 111.4 lbs Total Body Water (lbs): 75.8 lbs Visceral Fat Rating : 11     HPI  Chief Complaint: OBESITY  Tracy Reilly is here to discuss her progress with her obesity treatment plan. She is on the the Category 2 Plan and states she is following her eating plan approximately 50 % of the time. She states she is walking 30-60 minutes 3-4 times per week.   Interval History:  Since last office visit she is down 0 lb She has a net weight loss of 8 lb in the past month  She has had a family vacation and got off meal plan She has been triggered to over snack while working from home She is getting her meals in She is getting in lean protein with meals She is walking and swimming more She plans to increase walking time and try out You Tube videos Her husband has been supportive  Pharmacotherapy: metformin  PHYSICAL EXAM:  Blood pressure 137/81, pulse 71, temperature 97.9 F (36.6 C), height 5' 6.5" (1.689 m), weight 204 lb (92.5 kg), SpO2 100%. Body mass index is 32.43 kg/m.  General: She is overweight, cooperative, alert, well developed, and in no acute distress. PSYCH: Has normal mood, affect and thought process.   Lungs: Normal breathing effort, no conversational dyspnea.   ASSESSMENT AND PLAN  TREATMENT PLAN FOR OBESITY:  Recommended Dietary Goals  Tracy Reilly is currently in the action stage of change. As such, her goal is to continue weight management plan. She has agreed to the Category 2 Plan.  Behavioral Intervention  We discussed the following Behavioral Modification Strategies today: increasing lean protein intake, decreasing simple carbohydrates ,  increasing vegetables, increasing lower glycemic fruits, increasing fiber rich foods, increasing water intake, avoiding temptations and identifying enticing environmental cues, continue to work on implementation of reduced calorie nutritional plan, continue to practice mindfulness when eating, and planning for success.  Additional resources provided today: NA  Recommended Physical Activity Goals  Tracy Reilly has been advised to work up to 150 minutes of moderate intensity aerobic activity a week and strengthening exercises 2-3 times per week for cardiovascular health, weight loss maintenance and preservation of muscle mass.   She has agreed to [employer] strengthening exercises with a goal of 2-3 sessions a week  and Increase the intensity, frequency or duration of aerobic exercises    Pharmacotherapy changes for the treatment of obesity: discontinue metformin; begin Trokendi XR 25 mg daily  ASSOCIATED CONDITIONS ADDRESSED TODAY  Polyphagia Assessment & Plan: Struggling with boredom/ stress snacking while working from home She is a good candidate for use of Trokendi XR 25 mg qAM for food impulse control and cravings Keep junk food triggers out of the house Stock up on high fiber/ high protein snacks and work on eating schedule  Orders: -     Topiramate ER; Take 1 capsule (25 mg total) by mouth daily.  Dispense: 30 capsule; Refill: 0  Prediabetes Assessment & Plan: She did increase metformin XR to 750 mg daily but has had problems with gas pain even with the 500 mg dose. She has been constipated even with a daily fiber supplement,  increased water intake and fruits and veggies intake daily. She avoids high sugar foods and drinks and has limited starch intake to her meal plan.  Did not see much improvement in cravings since starting metformin.  Lab Results  Component Value Date   HGBA1C 5.9 (H) 03/31/2023    Discontinue metformin due to GI side effects Continue a low sugar/ low starch diet,  weight loss and regular exercise.     Generalized obesity with starting BMI 33  BMI 32.0-32.9,adult  Vitamin D deficiency Assessment & Plan: Last vitamin D Lab Results  Component Value Date   VD25OH 33.3 03/31/2023   She has added OTC vitamin D 3,000 international units  daily Tolerating well Energy level improving  Recheck level in the next 2-3 mos       She was informed of the importance of frequent follow up visits to maximize her success with intensive lifestyle modifications for her multiple health conditions.   ATTESTASTION STATEMENTS:  Reviewed by clinician on day of visit: allergies, medications, problem list, medical history, surgical history, family history, social history, and previous encounter notes pertinent to obesity diagnosis.   I have personally spent 30 minutes total time today in preparation, patient care, nutritional counseling and documentation for this visit, including the following: review of clinical lab tests; review of medical tests/procedures/services.      Tracy Brink, DO DABFM, DABOM [facility] [address] [place], [place] [address] [phone]

## 2023-05-13 NOTE — Assessment & Plan Note (Signed)
Struggling with boredom/ stress snacking while working from home She is a good candidate for use of Trokendi XR 25 mg qAM for food impulse control and cravings Keep junk food triggers out of the house Stock up on high fiber/ high protein snacks and work on eating schedule

## 2023-05-13 NOTE — Assessment & Plan Note (Signed)
Last vitamin D Lab Results  Component Value Date   VD25OH 33.3 03/31/2023   She has added OTC vitamin D 3,000 international units  daily Tolerating well Energy level improving  Recheck level in the next 2-3 mos

## 2023-05-18 DIAGNOSIS — D485 Neoplasm of uncertain behavior of skin: Secondary | ICD-10-CM | POA: Diagnosis not present

## 2023-05-18 DIAGNOSIS — D487 Neoplasm of uncertain behavior of other specified sites: Secondary | ICD-10-CM | POA: Diagnosis not present

## 2023-05-18 DIAGNOSIS — Z79899 Other long term (current) drug therapy: Secondary | ICD-10-CM | POA: Diagnosis not present

## 2023-05-18 DIAGNOSIS — L2084 Intrinsic (allergic) eczema: Secondary | ICD-10-CM | POA: Diagnosis not present

## 2023-05-18 MED ORDER — TOPIRAMATE 25 MG PO TABS: 25.0000 mg | ORAL_TABLET | Freq: Two times a day (BID) | ORAL | 0 refills | Status: DC

## 2023-05-18 NOTE — Addendum Note (Signed)
Addended by: [name] on: 05/18/2023 04:31 PM   Modules accepted: Orders

## 2023-05-29 ENCOUNTER — Other Ambulatory Visit (INDEPENDENT_AMBULATORY_CARE_PROVIDER_SITE_OTHER): Payer: Self-pay | Admitting: Family Medicine

## 2023-05-29 DIAGNOSIS — R632 Polyphagia: Secondary | ICD-10-CM

## 2023-05-31 DIAGNOSIS — I10 Essential (primary) hypertension: Secondary | ICD-10-CM | POA: Diagnosis not present

## 2023-05-31 DIAGNOSIS — E559 Vitamin D deficiency, unspecified: Secondary | ICD-10-CM | POA: Diagnosis not present

## 2023-05-31 DIAGNOSIS — E781 Pure hyperglyceridemia: Secondary | ICD-10-CM | POA: Diagnosis not present

## 2023-05-31 DIAGNOSIS — E538 Deficiency of other specified B group vitamins: Secondary | ICD-10-CM | POA: Diagnosis not present

## 2023-06-03 ENCOUNTER — Ambulatory Visit (INDEPENDENT_AMBULATORY_CARE_PROVIDER_SITE_OTHER): Payer: BC Managed Care – PPO | Admitting: Family Medicine

## 2023-06-03 ENCOUNTER — Encounter (INDEPENDENT_AMBULATORY_CARE_PROVIDER_SITE_OTHER): Payer: Self-pay | Admitting: Family Medicine

## 2023-06-03 VITALS — BP 126/77 | HR 64 | Temp 98.1°F | Ht 66.5 in | Wt 203.0 lb

## 2023-06-03 DIAGNOSIS — E669 Obesity, unspecified: Secondary | ICD-10-CM

## 2023-06-03 DIAGNOSIS — I10 Essential (primary) hypertension: Secondary | ICD-10-CM

## 2023-06-03 DIAGNOSIS — R7303 Prediabetes: Secondary | ICD-10-CM

## 2023-06-03 DIAGNOSIS — Z6832 Body mass index (BMI) 32.0-32.9, adult: Secondary | ICD-10-CM

## 2023-06-03 NOTE — Assessment & Plan Note (Signed)
BP is well controlled on Lisinopril 20 mg daily and Norvasc 5 mg daily She is actively working on weight reduction Look for BP improvements with further weight loss

## 2023-06-03 NOTE — Assessment & Plan Note (Signed)
Lab Results  Component Value Date   HGBA1C 5.9 (H) 03/31/2023   She is off of metformin due to gas pain and is working on a low sugar/ low starch diet rich in lean protein and fiber She has room to ramp up both cardio and resistance training exercise with work being somewhat of a barrier  Continue to work on healthy lifestyle changes and repeat A1c in the Fall

## 2023-06-03 NOTE — Progress Notes (Signed)
Office: [phone]  /  Fax: [phone]  WEIGHT SUMMARY AND BIOMETRICS  Starting Date: 03/31/23  Starting Weight: 212lb   Weight Lost Since Last Visit: 1lb   Vitals Temp: 98.1 F (36.7 C) BP: 126/77 Pulse Rate: 64 SpO2: 100 %   Body Composition  Body Fat %: 42.5 % Fat Mass (lbs): 86.4 lbs Muscle Mass (lbs): 110.8 lbs Total Body Water (lbs): 79.2 lbs Visceral Fat Rating : 11     HPI  Chief Complaint: OBESITY  Tracy Reilly is here to discuss her progress with her obesity treatment plan. She is on the the Category 2 Plan and states she is following her eating plan approximately 80 % of the time. She states she is exercising 30 minutes 4 times per week.   Interval History:  Since last office visit she is down 1 lb She has a net weight loss of 9 lb in the past 2 mos Her husband has been supportive She had to stop metformin due to gas pain She started Trokendi XR 25 mg daily for food impulse control but stopped due to dry mouth and dry nose after a week She did walk more last week, walking dog She has been working more this week leaving little time for exercise She plans to walk on the beach next week  Pharmacotherapy: none  PHYSICAL EXAM:  Blood pressure 126/77, pulse 64, temperature 98.1 F (36.7 C), height 5' 6.5" (1.689 m), weight 203 lb (92.1 kg), SpO2 100%. Body mass index is 32.27 kg/m.  General: She is overweight, cooperative, alert, well developed, and in no acute distress. PSYCH: Has normal mood, affect and thought process.   Lungs: Normal breathing effort, no conversational dyspnea.  ASSESSMENT AND PLAN  TREATMENT PLAN FOR OBESITY:  Recommended Dietary Goals  Val is currently in the action stage of change. As such, her goal is to continue weight management plan. She has agreed to the Category 2 Plan.  Behavioral Intervention  We discussed the following Behavioral Modification Strategies today: increasing lean protein intake, decreasing  simple carbohydrates , increasing vegetables, increasing lower glycemic fruits, increasing fiber rich foods, avoiding skipping meals, increasing water intake, work on meal planning and preparation, work on managing stress, creating time for self-care and relaxation measures, continue to practice mindfulness when eating, and planning for success.  Additional resources provided today: NA  Recommended Physical Activity Goals  Hinda has been advised to work up to 150 minutes of moderate intensity aerobic activity a week and strengthening exercises 2-3 times per week for cardiovascular health, weight loss maintenance and preservation of muscle mass.   She has agreed to Start aerobic activity with a goal of 150 minutes a week at moderate intensity.   Pharmacotherapy changes for the treatment of obesity: none  ASSOCIATED CONDITIONS ADDRESSED TODAY  Prediabetes Assessment & Plan: Lab Results  Component Value Date   HGBA1C 5.9 (H) 03/31/2023   She is off of metformin due to gas pain and is working on a low sugar/ low starch diet rich in lean protein and fiber She has room to ramp up both cardio and resistance training exercise with work being somewhat of a barrier  Continue to work on healthy lifestyle changes and repeat A1c in the Fall   Generalized obesity with starting BMI 33  BMI 32.0-32.9,adult  Primary hypertension Assessment & Plan: BP is well controlled on Lisinopril 20 mg daily and Norvasc 5 mg daily She is actively working on weight reduction Look for BP improvements with further weight  loss       She was informed of the importance of frequent follow up visits to maximize her success with intensive lifestyle modifications for her multiple health conditions.   ATTESTASTION STATEMENTS:  Reviewed by clinician on day of visit: allergies, medications, problem list, medical history, surgical history, family history, social history, and previous encounter notes pertinent to  obesity diagnosis.   I have personally spent 30 minutes total time today in preparation, patient care, nutritional counseling and documentation for this visit, including the following: review of clinical lab tests; review of medical tests/procedures/services.      [name], DO DABFM, DABOM [facility] [address] [place], [place] [address] [phone]

## 2023-06-18 DIAGNOSIS — R194 Change in bowel habit: Secondary | ICD-10-CM | POA: Diagnosis not present

## 2023-06-18 DIAGNOSIS — R1084 Generalized abdominal pain: Secondary | ICD-10-CM | POA: Diagnosis not present

## 2023-06-18 DIAGNOSIS — K529 Noninfective gastroenteritis and colitis, unspecified: Secondary | ICD-10-CM | POA: Diagnosis not present

## 2023-06-28 ENCOUNTER — Other Ambulatory Visit (HOSPITAL_BASED_OUTPATIENT_CLINIC_OR_DEPARTMENT_OTHER): Payer: Self-pay | Admitting: Pain Medicine

## 2023-06-28 DIAGNOSIS — E785 Hyperlipidemia, unspecified: Secondary | ICD-10-CM

## 2023-07-06 ENCOUNTER — Ambulatory Visit (INDEPENDENT_AMBULATORY_CARE_PROVIDER_SITE_OTHER): Payer: BC Managed Care – PPO | Admitting: Adult Health

## 2023-07-07 ENCOUNTER — Ambulatory Visit (HOSPITAL_BASED_OUTPATIENT_CLINIC_OR_DEPARTMENT_OTHER)
Admission: RE | Admit: 2023-07-07 | Discharge: 2023-07-07 | Disposition: A | Discharge status: Home / Self Care | Payer: BC Managed Care – PPO | Source: Ambulatory Visit | Attending: Pain Medicine | Admitting: Pain Medicine

## 2023-07-07 DIAGNOSIS — E785 Hyperlipidemia, unspecified: Secondary | ICD-10-CM | POA: Insufficient documentation

## 2023-07-12 DIAGNOSIS — K5730 Diverticulosis of large intestine without perforation or abscess without bleeding: Secondary | ICD-10-CM | POA: Diagnosis not present

## 2023-07-12 DIAGNOSIS — R194 Change in bowel habit: Secondary | ICD-10-CM | POA: Diagnosis not present

## 2023-07-19 ENCOUNTER — Ambulatory Visit (INDEPENDENT_AMBULATORY_CARE_PROVIDER_SITE_OTHER): Payer: BC Managed Care – PPO | Admitting: Adult Health

## 2023-07-19 ENCOUNTER — Encounter (INDEPENDENT_AMBULATORY_CARE_PROVIDER_SITE_OTHER): Payer: Self-pay | Admitting: Adult Health

## 2023-07-19 VITALS — BP 125/56 | HR 57 | Temp 97.8°F | Ht 66.5 in | Wt 201.0 lb

## 2023-07-19 DIAGNOSIS — E88819 Insulin resistance, unspecified: Secondary | ICD-10-CM | POA: Diagnosis not present

## 2023-07-19 DIAGNOSIS — E669 Obesity, unspecified: Secondary | ICD-10-CM

## 2023-07-19 DIAGNOSIS — E7849 Other hyperlipidemia: Secondary | ICD-10-CM

## 2023-07-19 DIAGNOSIS — E559 Vitamin D deficiency, unspecified: Secondary | ICD-10-CM

## 2023-07-19 DIAGNOSIS — Z6831 Body mass index (BMI) 31.0-31.9, adult: Secondary | ICD-10-CM

## 2023-07-19 DIAGNOSIS — K760 Fatty (change of) liver, not elsewhere classified: Secondary | ICD-10-CM

## 2023-07-19 MED ORDER — WEGOVY 0.25 MG/0.5ML ~~LOC~~ SOAJ: 0.2500 mg | SUBCUTANEOUS | 0 refills | Status: DC

## 2023-07-19 NOTE — Progress Notes (Signed)
WEIGHT SUMMARY AND BIOMETRICS  Vitals Temp: 97.8 F (36.6 C) BP: (!) 125/56 Pulse Rate: (!) 57 SpO2: 99 %   Anthropometric Measurements Height: 5' 6.5" (1.689 m) Weight: 201 lb (91.2 kg) BMI (Calculated): 31.96 Weight at Last Visit: 203lb Weight Lost Since Last Visit: 2lb Weight Gained Since Last Visit: 0 Starting Weight: 212lb Total Weight Loss (lbs): 11 lb (4.99 kg) Peak Weight: 205lb   Body Composition  Body Fat %: 42.9 % Fat Mass (lbs): 86.4 lbs Muscle Mass (lbs): 109.2 lbs Total Body Water (lbs): 78.2 lbs Visceral Fat Rating : 11   Other Clinical Data Fasting: no Labs: no Today's Visit #: 6 Starting Date: 03/31/23    Chief Complaint:   OBESITY Tracy Reilly is here to discuss her progress with her obesity treatment plan. She is on the the Category 2 Plan and states she is following her eating plan approximately 70 % of the time. She states she is exercising NEAT activities.   Interim History:  Ms. Rebello provided the following food recall that is typical of a day: Breakfast: [place] sausage on 40 cal bread with cheese and mustard Lunch: Chicken skewers, salad with tomatoes and feta cheese Dinner: Meat protein, beans, an 1-2 cups of vegetables  Hydration-she reports only drinking water and unsweetened tea  Ms. Vanriper denies "formal exercise", however keeps her young grandchildren (ages 51,4, 50 months, and ) on weekends  Subjective:   1. Vitamin D deficiency  Latest Reference Range & Units 03/31/23 09:02  Vitamin D, 25-Hydroxy 30.0 - 100.0 ng/mL 33.3   She is on Vit D 3 2,000 international units daily     2. Insulin resistance  Latest Reference Range & Units 03/31/23 09:02  Glucose 70 - 99 mg/dL 86  Hemoglobin W0J 4.8 - 5.6 % 5.9 (H)  Est. average glucose Bld gHb Est-mCnc mg/dL 811  INSULIN 2.6 - 91.4 uIU/mL 19.6  (H): Data is abnormally high  She is not currently on any antidiabetic medications She denies family hx of MEN 2 ot MTC She  denies personal hx of pancreatitis Discussed risks/benefits of GLP-1 therapy.  HLD, Insulin Resistance, Obessity- recommend [facility] therapy 3. Other hyperlipidemia CT Cardiac Score 07/07/2003 IMPRESSION: Coronary calcium score of 410. This was 99th percentile for age-, race-, and sex-matched controls.   RECOMMENDATIONS: Coronary artery calcium (CAC) score is a strong predictor of incident coronary heart disease (CHD) and provides predictive information beyond traditional risk factors. CAC scoring is reasonable to use in the decision to withhold, postpone, or initiate statin therapy in intermediate-risk or selected borderline-risk asymptomatic adults (age 16-75 years and LDL-C >=70 to <190 mg/dL) who do not have diabetes or established atherosclerotic cardiovascular disease (ASCVD).* In intermediate-risk (10-year ASCVD risk >=7.5% to <20%) adults or selected borderline-risk (10-year ASCVD risk >=5% to <7.5%) adults in whom a CAC score is measured for the purpose of making a treatment decision the following recommendations have been made:   If CAC=0, it is reasonable to withhold statin therapy and reassess in 5 to 10 years, as long as higher risk conditions are absent (diabetes mellitus, family history of premature CHD in first degree relatives (males <55 years; females <65 years), cigarette smoking, or LDL >=190 mg/dL).   If CAC is 1 to 99, it is reasonable to initiate statin therapy for patients >=19 years of age.   If CAC is >=100 or >=75th percentile, it is reasonable to initiate statin therapy at any age.   Cardiology referral should be considered for  patients with CAC scores >=400 or >=75th percentile.  She denies family hx of MEN 2 ot MTC She denies personal hx of pancreatitis Discussed risks/benefits of GLP-1 therapy.  HLD, Insulin Resistance, Obessity- recommend ZOXWRU therapy  Assessment/Plan:   1. Vitamin D deficiency Continue daily OTC supplementation  2.  Insulin resistance Continue prescribed Cat 2 meal plan and increase regular exercise  3. Other hyperlipidemia Start Semaglutide-Weight Management (WEGOVY) 0.25 MG/0.5ML SOAJ Inject 0.25 mg into the skin once a week. Dispense: 2 mL, Refills: 0 ordered   If denied- initiate a patient appeal- information provided to pt on how to accomplish  4. Obesity (BMI 30-39.9), Current BMI 31.96 Start [facility] therapy as directed.  Shayanne is currently in the action stage of change. As such, her goal is to continue with weight loss efforts. She has agreed to the Category 2 Plan.   Exercise goals: All adults should avoid inactivity. Some physical activity is better than none, and adults who participate in any amount of physical activity gain some health benefits. Adults should also include muscle-strengthening activities that involve all major muscle groups on 2 or more days a week.  Behavioral modification strategies: increasing lean protein intake, decreasing simple carbohydrates, increasing vegetables, increasing water intake, meal planning and cooking strategies, and planning for success.  Shabrina has agreed to follow-up with our clinic in 3 weeks. She was informed of the importance of frequent follow-up visits to maximize her success with intensive lifestyle modifications for her multiple health conditions.   Objective:   Blood pressure (!) 125/56, pulse (!) 57, temperature 97.8 F (36.6 C), height 5' 6.5" (1.689 m), weight 201 lb (91.2 kg), SpO2 99%. Body mass index is 31.96 kg/m.  General: Cooperative, alert, well developed, in no acute distress. HEENT: Conjunctivae and lids unremarkable. Cardiovascular: Regular rhythm.  Lungs: Normal work of breathing. Neurologic: No focal deficits.   Lab Results  Component Value Date   CREATININE 0.67 03/31/2023   BUN 25 (H) 03/31/2023   NA 139 03/31/2023   K 4.2 03/31/2023   CL 101 03/31/2023   CO2 21 03/31/2023   Lab Results  Component Value Date    ALT 26 03/31/2023   AST 17 03/31/2023   ALKPHOS 83 03/31/2023   BILITOT 0.3 03/31/2023   Lab Results  Component Value Date   HGBA1C 5.9 (H) 03/31/2023   Lab Results  Component Value Date   INSULIN 19.6 03/31/2023   Lab Results  Component Value Date   TSH 1.510 03/31/2023   No results found for: "CHOL", "HDL", "LDLCALC", "LDLDIRECT", "TRIG", "CHOLHDL" Lab Results  Component Value Date   VD25OH 33.3 03/31/2023   Lab Results  Component Value Date   WBC 8.9 10/16/2021   HGB 13.0 10/16/2021   HCT 38.7 10/16/2021   MCV 87.0 10/16/2021   PLT 318 10/16/2021   No results found for: "IRON", "TIBC", "FERRITIN"  Attestation Statements:   Reviewed by clinician on day of visit: allergies, medications, problem list, medical history, surgical history, family history, social history, and previous encounter notes.  I have reviewed the above documentation for accuracy and completeness, and I agree with the above. -  Katherin Ramey d. Desare Duddy, NP-C

## 2023-07-20 DIAGNOSIS — D485 Neoplasm of uncertain behavior of skin: Secondary | ICD-10-CM | POA: Diagnosis not present

## 2023-07-20 DIAGNOSIS — E781 Pure hyperglyceridemia: Secondary | ICD-10-CM | POA: Diagnosis not present

## 2023-07-20 DIAGNOSIS — I700 Atherosclerosis of aorta: Secondary | ICD-10-CM | POA: Diagnosis not present

## 2023-07-20 DIAGNOSIS — L988 Other specified disorders of the skin and subcutaneous tissue: Secondary | ICD-10-CM | POA: Diagnosis not present

## 2023-07-20 DIAGNOSIS — Z23 Encounter for immunization: Secondary | ICD-10-CM | POA: Diagnosis not present

## 2023-08-16 ENCOUNTER — Encounter (INDEPENDENT_AMBULATORY_CARE_PROVIDER_SITE_OTHER): Payer: Self-pay | Admitting: Adult Health

## 2023-08-16 ENCOUNTER — Telehealth (INDEPENDENT_AMBULATORY_CARE_PROVIDER_SITE_OTHER): Payer: Self-pay

## 2023-08-16 ENCOUNTER — Ambulatory Visit (INDEPENDENT_AMBULATORY_CARE_PROVIDER_SITE_OTHER): Payer: BC Managed Care – PPO | Admitting: Adult Health

## 2023-08-16 VITALS — BP 122/79 | HR 63 | Temp 97.7°F | Ht 66.5 in | Wt 198.0 lb

## 2023-08-16 DIAGNOSIS — K760 Fatty (change of) liver, not elsewhere classified: Secondary | ICD-10-CM | POA: Diagnosis not present

## 2023-08-16 DIAGNOSIS — E559 Vitamin D deficiency, unspecified: Secondary | ICD-10-CM | POA: Diagnosis not present

## 2023-08-16 DIAGNOSIS — E785 Hyperlipidemia, unspecified: Secondary | ICD-10-CM

## 2023-08-16 DIAGNOSIS — Z6831 Body mass index (BMI) 31.0-31.9, adult: Secondary | ICD-10-CM

## 2023-08-16 DIAGNOSIS — E669 Obesity, unspecified: Secondary | ICD-10-CM | POA: Diagnosis not present

## 2023-08-16 NOTE — Telephone Encounter (Signed)
PA submitted for [facility], waiting on determination.

## 2023-08-16 NOTE — Telephone Encounter (Signed)
PA for [facility] was denied. Heres a copy of the denial letter:   08/16/2023 William Hamburger [address] [place], [place] [address] Plan member ID: [id number] Case number: [id number] Prescriber name: William Hamburger Prescriber fax: [phone] NOTICE OF DENIAL Dear Legrand Como, On 08/16/2023, we received a request from your provider for coverage of Wegovy Inj 0.25mg . An appropriate clinician reviewed all of the information you and/or your provider sent to [place]. Unfortunately, we must deny coverage. Why was my request denied? This request was denied because you did not meet the following requirements:  The requested medication and/or diagnosis are not a covered benefit and excluded from coverage in accordance with the terms and conditions of your plan benefit. The requested drug is not covered by the plan. Please contact member services for further assistance. How can I obtain the material(s) used to review this request? You may request, free of charge, a copy of the drug coverage policy, actual benefit provision, guideline, protocol or other information that factored into the decision, including the diagnosis code and the treatment code and their corresponding meanings, by calling us at [phone], or by writing to the address below: [employer] c/o Prior Authorization Guidelines [address] [place], [place] [address]  Pt has been notified.

## 2023-08-16 NOTE — Progress Notes (Signed)
WEIGHT SUMMARY AND BIOMETRICS  Vitals Temp: 97.7 F (36.5 C) BP: 122/79 Pulse Rate: 63 SpO2: 98 %   Anthropometric Measurements Height: 5' 6.5" (1.689 m) Weight: 198 lb (89.8 kg) BMI (Calculated): 31.48 Weight at Last Visit: 201lb Weight Lost Since Last Visit: 3lb Weight Gained Since Last Visit: 0 Starting Weight: 212lb Total Weight Loss (lbs): 14 lb (6.35 kg) Peak Weight: 205lb   Body Composition  Body Fat %: 41.1 % Fat Mass (lbs): 81.6 lbs Muscle Mass (lbs): 111 lbs Total Body Water (lbs): 76.2 lbs Visceral Fat Rating : 10   Other Clinical Data Fasting: no Labs: no Today's Visit #: 7 Starting Date: 03/31/23    Chief Complaint:   OBESITY Tracy Reilly is here to discuss her progress with her obesity treatment plan. She is on the the Category 2 Plan and states she is following her eating plan approximately 75 % of the time. She states she is exercising Walking 20 minutes 1 times per week.   Interim History:  [name] cared for her three young grandchildren last week. She reports nasal congestion and productive cough (yellow/green mucus) the last 3 days. She denies fever or GI upset.  Reviewed Bioimpedance results with pt: Muscle Mass: +1.8 lbs Adipose Mass: -4.8 lbs  Subjective:   1. Hyperlipidemia, unspecified hyperlipidemia type She was previously on Lipitor and experienced HA PCP stopped Lipitor and replaced with 10mg  daily. She denies myalgias with current statin therapy.  Per pt- her father suffered from renal cancer and multiple CVAs Her mother has HTN and HLD  2. Vitamin D deficiency She is on Vit D 3 2,000 international units daily   Latest Reference Range & Units 03/31/23 09:02  Vitamin D, 25-Hydroxy 30.0 - 100.0 ng/mL 33.3   She is on She is on Vit D 3 2,000 international units daily  She endorses fatigue  3. NAFLD (nonalcoholic fatty liver disease) She denies RUQ pain or N/V 09/18/2021 EXAM: ULTRASOUND ABDOMEN LIMITED RIGHT UPPER  QUADRANT   COMPARISON:  09/01/2021   FINDINGS: Gallbladder:   No gallstones or wall thickening visualized. No sonographic Murphy sign noted by sonographer.   Common bile duct:   Diameter: 3.8 mm.   Liver:   Diffusely increased in echogenicity consistent with fatty infiltration. No focal mass is noted. Portal vein is patent on color Doppler imaging with normal direction of blood flow towards the liver.   Other: None.   IMPRESSION: Fatty liver.  Assessment/Plan:   1. Hyperlipidemia, unspecified hyperlipidemia type Limit saturated fat Continue Crestor 10mg   2. Vitamin D deficiency Continue Vit D 3 2,000 international units daily   3. NAFLD (nonalcoholic fatty liver disease) Continue with weight loss efforts  4. Obesity (BMI 30-39.9), Current BMI 31.96 Start Wegovy as directed  Tracy Reilly is currently in the action stage of change. As such, her goal is to continue with weight loss efforts. She has agreed to the Category 2 Plan.   Exercise goals: For substantial health benefits, adults should do at least 150 minutes (2 hours and 30 minutes) a week of moderate-intensity, or 75 minutes (1 hour and 15 minutes) a week of vigorous-intensity aerobic physical activity, or an equivalent combination of moderate- and vigorous-intensity aerobic activity. Aerobic activity should be performed in episodes of at least 10 minutes, and preferably, it should be spread throughout the week.  Behavioral modification strategies: increasing lean protein intake, decreasing simple carbohydrates, increasing vegetables, increasing water intake, no skipping meals, meal planning and cooking strategies, and planning for  success.  Amery has agreed to follow-up with our clinic in 3 weeks. She was informed of the importance of frequent follow-up visits to maximize her success with intensive lifestyle modifications for her multiple health conditions.   Fasting Labs to be completed with her PCP/Dr.  Kinnie Reilly in Dec 2024  Objective:   Blood pressure 122/79, pulse 63, temperature 97.7 F (36.5 C), height 5' 6.5" (1.689 m), weight 198 lb (89.8 kg), SpO2 98%. Body mass index is 31.48 kg/m.  General: Cooperative, alert, well developed, in no acute distress. HEENT: Conjunctivae and lids unremarkable. Cardiovascular: Regular rhythm.  Lungs: Normal work of breathing. Neurologic: No focal deficits.   Lab Results  Component Value Date   CREATININE 0.67 03/31/2023   BUN 25 (H) 03/31/2023   NA 139 03/31/2023   K 4.2 03/31/2023   CL 101 03/31/2023   CO2 21 03/31/2023   Lab Results  Component Value Date   ALT 26 03/31/2023   AST 17 03/31/2023   ALKPHOS 83 03/31/2023   BILITOT 0.3 03/31/2023   Lab Results  Component Value Date   HGBA1C 5.9 (H) 03/31/2023   Lab Results  Component Value Date   INSULIN 19.6 03/31/2023   Lab Results  Component Value Date   TSH 1.510 03/31/2023   No results found for: "CHOL", "HDL", "LDLCALC", "LDLDIRECT", "TRIG", "CHOLHDL" Lab Results  Component Value Date   VD25OH 33.3 03/31/2023   Lab Results  Component Value Date   WBC 8.9 10/16/2021   HGB 13.0 10/16/2021   HCT 38.7 10/16/2021   MCV 87.0 10/16/2021   PLT 318 10/16/2021   No results found for: "IRON", "TIBC", "FERRITIN"  Attestation Statements:   Reviewed by clinician on day of visit: allergies, medications, problem list, medical history, surgical history, family history, social history, and previous encounter notes.  Time spent on visit including pre-visit chart review and post-visit care and charting was 28 minutes.   I have reviewed the above documentation for accuracy and completeness, and I agree with the above. -  Deano Tomaszewski d. Gursimran Litaker, NP-C

## 2023-09-08 ENCOUNTER — Ambulatory Visit (INDEPENDENT_AMBULATORY_CARE_PROVIDER_SITE_OTHER): Payer: BC Managed Care – PPO | Admitting: Adult Health

## 2023-09-14 DIAGNOSIS — Z79899 Other long term (current) drug therapy: Secondary | ICD-10-CM | POA: Diagnosis not present

## 2023-09-14 DIAGNOSIS — L2084 Intrinsic (allergic) eczema: Secondary | ICD-10-CM | POA: Diagnosis not present

## 2023-10-14 DIAGNOSIS — Z01419 Encounter for gynecological examination (general) (routine) without abnormal findings: Secondary | ICD-10-CM | POA: Diagnosis not present

## 2023-10-14 DIAGNOSIS — Z6833 Body mass index (BMI) 33.0-33.9, adult: Secondary | ICD-10-CM | POA: Diagnosis not present

## 2023-10-14 DIAGNOSIS — Z1231 Encounter for screening mammogram for malignant neoplasm of breast: Secondary | ICD-10-CM | POA: Diagnosis not present

## 2023-11-18 DIAGNOSIS — J0110 Acute frontal sinusitis, unspecified: Secondary | ICD-10-CM | POA: Diagnosis not present

## 2023-12-08 DIAGNOSIS — R7303 Prediabetes: Secondary | ICD-10-CM | POA: Diagnosis not present

## 2023-12-15 DIAGNOSIS — Z79899 Other long term (current) drug therapy: Secondary | ICD-10-CM | POA: Diagnosis not present

## 2023-12-15 DIAGNOSIS — R399 Unspecified symptoms and signs involving the genitourinary system: Secondary | ICD-10-CM | POA: Diagnosis not present

## 2023-12-15 DIAGNOSIS — L2084 Intrinsic (allergic) eczema: Secondary | ICD-10-CM | POA: Diagnosis not present

## 2024-01-13 DIAGNOSIS — Z1331 Encounter for screening for depression: Secondary | ICD-10-CM | POA: Diagnosis not present

## 2024-01-13 DIAGNOSIS — K760 Fatty (change of) liver, not elsewhere classified: Secondary | ICD-10-CM | POA: Diagnosis not present

## 2024-01-13 DIAGNOSIS — E781 Pure hyperglyceridemia: Secondary | ICD-10-CM | POA: Diagnosis not present

## 2024-01-13 DIAGNOSIS — E559 Vitamin D deficiency, unspecified: Secondary | ICD-10-CM | POA: Diagnosis not present

## 2024-01-13 DIAGNOSIS — Z780 Asymptomatic menopausal state: Secondary | ICD-10-CM | POA: Diagnosis not present

## 2024-01-27 DIAGNOSIS — R931 Abnormal findings on diagnostic imaging of heart and coronary circulation: Secondary | ICD-10-CM | POA: Diagnosis not present

## 2024-01-27 DIAGNOSIS — K760 Fatty (change of) liver, not elsewhere classified: Secondary | ICD-10-CM | POA: Diagnosis not present

## 2024-01-27 DIAGNOSIS — I700 Atherosclerosis of aorta: Secondary | ICD-10-CM | POA: Diagnosis not present

## 2024-01-27 DIAGNOSIS — E781 Pure hyperglyceridemia: Secondary | ICD-10-CM | POA: Diagnosis not present

## 2024-02-08 NOTE — Progress Notes (Unsigned)
 Cardiology Office Note:  .   Date:  02/09/2024 ID:  Tracy Reilly, DOB May 29, 1966, MRN [id number] PCP: Sigmund Hazel, MD [facility] Providers Cardiologist:  None   Patient Profile: .      PMH Coronary artery disease CT Calcium score 07/20/2023 CAC score 410 (99th percentile) LM 6, LAD 273, LCx 60, RCA 71 Hepatic steatosis Dyslipidemia Prediabetes Obesity Aortic atherosclerosis Hypertension Flow murmur (thinks echo was in 1997)       History of Present Illness: .   Tracy Reilly is a very pleasant 58 y.o. female  who is here today for new patient consult for elevated coronary artery calcium score. She reports she has occasional chest discomfort and shortness of breath, previously attributed to weight and indigestion. Chest pain sometimes occurs when just sitting, for example she felt this while driving to the beach recently. Shortness of breath occurs more with activity including going up and down her steps doing laundry.  Occasional palpitations not specifically associated with activity.  Mild leg edema, particularly when it is hot outside or she has been standing for long periods. She denies orthopnea, PND, presyncope, syncope.  She has been working on lifestyle modification, including diet and exercise, to manage her prediabetes and weight.  She was started on rosuvastatin October 2024.  She is also taking aspirin.  She has family history of heart problems and strokes, with her father as well as his parents and all of his siblings having heart issues. She is currently on rosuvastatin for high cholesterol and amlodipine and lisinopril for blood pressure control. She also reports occasional leg swelling, particularly when outside in hot weather or after standing for long periods.  She works from home and admits it is mostly sedentary, although she does have a stand-up desk to use.  Diet includes frequent restaurant meals including chicken and salads from [employer] and  [employer].   Family history: Her family history includes Alcohol abuse in her father; Cancer in her father; Diabetes in her mother; Hyperlipidemia in her mother; Hypertension in her mother; Liver disease in her father; Obesity in her father; Stroke in her father.  Multiple strokes in father, prob in his 7s Paternal grandparents and all of his siblings had heart issues Occasional   Discussed the use of AI scribe software for clinical note transcription with the patient, who gave verbal consent to proceed.  ASCVD Risk Score: ASCVD (Atherosclerotic Cardiovascular Disease) Risk Algorithm including Known ASCVD from AHA/ACC from [email or web address]  on 02/09/2024 ** All calculations should be rechecked by clinician prior to use **  RESULT SUMMARY: 4.4 % Risk of cardiovascular event (coronary or stroke death or non-fatal MI or stroke) in next 10 years.    Moderate-intensity statin recommended because of known diabetes but 10-year risk <7.5%.   If LDL 70mg /dl (4.09 mmol/L), additional factors like lifestyle and risk-benefits can be considered before starting statins.  To view statin dosages by intensity, see Evidence section.   INPUTS: History of ASCVD --> 0 = No LDL Cholesterol >=190mg /dL (8.11 mmol/L) --> 0 = No Age --> 57 years Diabetes --> 1 = Yes Sex --> 0 = Female Total Cholesterol --> 221 mg/dL HDL Cholesterol --> 58 mg/dL Systolic Blood Pressure --> 110 mm Hg Treatment for Hypertension --> 1 = Yes Smoker --> 0 = No Race --> 1 = White   Diet: Likes to eat out more than at home, mostly chicken and salad, like [employer] and [employer] Some veggies Rare sweets Prefers salty snacks -  chips but is reducing  Unsweet tea and water Rare Etoh  Activity: Works from home, stand up desk but often sitting No regular exercise routine   No results found for: "LIPOA"    ROS: See HPI       Studies Reviewed: Marland Kitchen   EKG Interpretation Date/Time:  Wednesday February 09 2024 14:43:23  EDT Ventricular Rate:  71 PR Interval:  156 QRS Duration:  88 QT Interval:  398 QTC Calculation: 432 R Axis:   91  Text Interpretation: Normal sinus rhythm Rightward axis No previous ECGs available Confirmed by Eligha Bridegroom [phone]) on 02/09/2024 3:02:54 PM      Risk Assessment/Calculations:             Physical Exam:   VS: BP 110/68   Pulse 77   Ht 5' 6.5" (1.689 m)   Wt 213 lb 9.6 oz (96.9 kg)   SpO2 97%   BMI 33.96 kg/m   Wt Readings from Last 3 Encounters:  02/09/24 213 lb 9.6 oz (96.9 kg)  08/16/23 198 lb (89.8 kg)  07/19/23 201 lb (91.2 kg)     GEN: Obese, well developed in no acute distress NECK: No JVD; No carotid bruits CARDIAC: RRR, no murmurs, rubs, gallops RESPIRATORY:  Clear to auscultation without rales, wheezing or rhonchi  ABDOMEN: Soft, non-tender, non-distended EXTREMITIES:  No edema; No deformity     ASSESSMENT AND PLAN: .    CAD: CT calcium score for 10 (99th percentile) with bulk of calcium in LAD. She is having chest pain at times with exertion and at rest. SOB occurs with activity, including going up and down stairs doing laundry. No associated symptoms of diaphoresis, n/v.  She has additional risk factors of hypertension, hyperlipidemia, prediabetes, and family history of ASCVD. We will get coronary CTA for ischemia evaluation. Will have her take Lopressor 100 mg 2 hours prior to test. Continue aspirin, rosuvastatin. We are checking lipid panel to ensure LDL is well-controlled.  Advised her to start walking for exercise and notify us if symptoms worsen.  Hypertension: BP is well-controlled on lisinopril and amlodipine. No change in anti-hypertensive therapy today.   Weight management: Encouraged heart healthy mostly plant based diet avoiding saturated fat, processed foods, simple carbohydrates, and sugar along with aiming for at least 150 minutes of moderate intensity exercise each week.   Hyperlipidemia  LDL goal < 70: Lipid panel completed  05/31/2023 with total cholesterol 221, HDL 58, LDL-C 136, and triglycerides 150.  She started rosuvastatin 10 mg October 2024.  We will get fasting LP(a), NMR, and ALT to evaluate effectiveness of rosuvastatin.  CV Risk Assessment: ASCVD risk score 4.4%.  We discussed the individual components of this including history of hypertension and hyperlipidemia.  She was recently diagnosed with prediabetes with A1c of 5.9% 03/31/2023.  She is working on improving her diet and weight loss.  We will recheck lipids to ensure LDL is well-controlled.  Plan/Goals: 1: Aim to decrease intake of fast food chicken and increase intake of whole grains, vegetables, and lean protein 2: Consider use of My Fitness Pal app 3: Aim for 150 minutes of moderate intensity exercise each week along with 20-30 minutes of weight lifting 2-3 days per week       Disposition: 3 months with me  Signed, Eligha Bridegroom, NP-C

## 2024-02-09 ENCOUNTER — Ambulatory Visit (HOSPITAL_BASED_OUTPATIENT_CLINIC_OR_DEPARTMENT_OTHER): Admitting: Nurse Practitioner

## 2024-02-09 ENCOUNTER — Encounter (HOSPITAL_BASED_OUTPATIENT_CLINIC_OR_DEPARTMENT_OTHER): Payer: Self-pay | Admitting: Nurse Practitioner

## 2024-02-09 VITALS — BP 110/68 | HR 77 | Ht 66.5 in | Wt 213.6 lb

## 2024-02-09 DIAGNOSIS — R0609 Other forms of dyspnea: Secondary | ICD-10-CM

## 2024-02-09 DIAGNOSIS — E785 Hyperlipidemia, unspecified: Secondary | ICD-10-CM

## 2024-02-09 DIAGNOSIS — Z7189 Other specified counseling: Secondary | ICD-10-CM

## 2024-02-09 DIAGNOSIS — I25118 Atherosclerotic heart disease of native coronary artery with other forms of angina pectoris: Secondary | ICD-10-CM | POA: Diagnosis not present

## 2024-02-09 DIAGNOSIS — I700 Atherosclerosis of aorta: Secondary | ICD-10-CM | POA: Diagnosis not present

## 2024-02-09 DIAGNOSIS — I10 Essential (primary) hypertension: Secondary | ICD-10-CM | POA: Diagnosis not present

## 2024-02-09 DIAGNOSIS — E66811 Obesity, class 1: Secondary | ICD-10-CM

## 2024-02-09 DIAGNOSIS — R072 Precordial pain: Secondary | ICD-10-CM | POA: Diagnosis not present

## 2024-02-09 MED ORDER — METOPROLOL TARTRATE 100 MG PO TABS: ORAL_TABLET | ORAL | 0 refills | Status: DC

## 2024-02-09 NOTE — Patient Instructions (Signed)
 Medication Instructions:  Your physician recommends that you continue on your current medications as directed. Please refer to the Current Medication list given to you today.  *If you need a refill on your cardiac medications before your next appointment, please call your pharmacy*  Lab Work: Your physician recommends that you return for lab work in the next few weeks fro Fasting Lpa, NMR, & ALT   Testing/Procedures:  [facility] [address] [place], [place] [address] [phone]  If scheduled at [facility], please arrive at the [facility] and Children's Entrance (Entrance C2) of [facility] 30 minutes prior to test start time.  If scheduled at [facility], please arrive 30 minutes early for check-in and test prep.  Please follow these instructions carefully (unless otherwise directed):  An IV will be required for this test and Nitroglycerin will be given.   On the Night Before the Test: Be sure to Drink plenty of water. Do not consume any caffeinated/decaffeinated beverages or chocolate 12 hours prior to your test. Do not take any antihistamines 12 hours prior to your test.  On the Day of the Test: Drink plenty of water until 1 hour prior to the test. Do not eat any food 1 hour prior to test. You may take your regular medications prior to the test.  Take metoprolol (Lopressor) 100mg   two hours prior to test. If you take Furosemide/Hydrochlorothiazide/Spironolactone/Chlorthalidone, please HOLD on the morning of the test. Patients who wear a continuous glucose monitor MUST remove the device prior to scanning. FEMALES- please wear underwire-free bra if available, avoid dresses & tight clothing      After the Test: Drink plenty of water. After receiving IV contrast, you may experience a mild flushed feeling. This is normal. On occasion, you may experience a mild rash up to 24 hours after the test. This is not dangerous. If this occurs, you  can take Benadryl 25 mg, Zyrtec, Claritin, or Allegra and increase your fluid intake. (Patients taking Tikosyn should avoid Benadryl, and may take Zyrtec, Claritin, or Allegra) If you experience trouble breathing, this can be serious. If it is severe call 911 IMMEDIATELY. If it is mild, please call our office.  We will call to schedule your test 2-4 weeks out understanding that some insurance companies will need an authorization prior to the service being performed.   For more information and frequently asked questions, please visit our website : [email or web address]  For non-scheduling related questions, please contact the cardiac imaging nurse navigator should you have any questions/concerns: Cardiac Imaging Nurse Navigators Direct Office Dial: [phone]   For scheduling needs, including cancellations and rescheduling, please call [place], [phone].  Follow-Up: At [facility], you and your health needs are our priority.  As part of our continuing mission to provide you with exceptional heart care, our providers are all part of one team.  This team includes your primary Cardiologist (physician) and Advanced Practice Providers or APPs (Physician Assistants and Nurse Practitioners) who all work together to provide you with the care you need, when you need it.  Your next appointment:   3 months with Eligha Bridegroom, NP   Other Instructions Goals: 1: Aim to decrease intake of fast food chicken and increase intake of whole grains, vegetables, and lean protein 2: Consider use of My Fitness Pal app 3: Aim for 150 minutes of moderate intensity exercise each week along with 20-30 minutes of weight lifting 2-3 days per week

## 2024-02-10 DIAGNOSIS — K760 Fatty (change of) liver, not elsewhere classified: Secondary | ICD-10-CM | POA: Diagnosis not present

## 2024-02-10 DIAGNOSIS — R931 Abnormal findings on diagnostic imaging of heart and coronary circulation: Secondary | ICD-10-CM | POA: Diagnosis not present

## 2024-02-10 DIAGNOSIS — E781 Pure hyperglyceridemia: Secondary | ICD-10-CM | POA: Diagnosis not present

## 2024-02-10 DIAGNOSIS — I700 Atherosclerosis of aorta: Secondary | ICD-10-CM | POA: Diagnosis not present

## 2024-02-17 DIAGNOSIS — R071 Chest pain on breathing: Secondary | ICD-10-CM | POA: Diagnosis not present

## 2024-02-18 LAB — LIPOPROTEIN A (LPA): Lipoprotein (a): 9.8 nmol/L (ref <75.0)

## 2024-02-18 LAB — NMR, LIPOPROFILE
Cholesterol, Total: 119 mg/dL (ref 100–199)
HDL Particle Number: 34.3 umol/L (ref >=30.5)
HDL-C: 54 mg/dL (ref >39)
LDL Particle Number: 523 nmol/L (ref <1000)
LDL Size: 19.7 nm — ABNORMAL LOW (ref >20.5)
LDL-C (NIH Calc): 50 mg/dL (ref 0–99)
LP-IR Score: <25 (ref <=45)
Small LDL Particle Number: 286 nmol/L (ref <=527)
Triglycerides: 74 mg/dL (ref 0–149)

## 2024-02-18 LAB — ALT: ALT: 24 IU/L (ref 0–32)

## 2024-02-21 ENCOUNTER — Encounter (HOSPITAL_BASED_OUTPATIENT_CLINIC_OR_DEPARTMENT_OTHER): Payer: Self-pay

## 2024-02-23 DIAGNOSIS — L723 Sebaceous cyst: Secondary | ICD-10-CM | POA: Diagnosis not present

## 2024-02-23 DIAGNOSIS — L820 Inflamed seborrheic keratosis: Secondary | ICD-10-CM | POA: Diagnosis not present

## 2024-02-23 DIAGNOSIS — D225 Melanocytic nevi of trunk: Secondary | ICD-10-CM | POA: Diagnosis not present

## 2024-02-23 DIAGNOSIS — L578 Other skin changes due to chronic exposure to nonionizing radiation: Secondary | ICD-10-CM | POA: Diagnosis not present

## 2024-02-23 DIAGNOSIS — L2084 Intrinsic (allergic) eczema: Secondary | ICD-10-CM | POA: Diagnosis not present

## 2024-02-23 DIAGNOSIS — L821 Other seborrheic keratosis: Secondary | ICD-10-CM | POA: Diagnosis not present

## 2024-02-29 ENCOUNTER — Encounter (HOSPITAL_COMMUNITY): Payer: Self-pay

## 2024-03-02 ENCOUNTER — Ambulatory Visit (HOSPITAL_COMMUNITY)
Admission: RE | Admit: 2024-03-02 | Discharge: 2024-03-02 | Disposition: A | Discharge status: Home / Self Care | Source: Ambulatory Visit | Attending: Nurse Practitioner | Admitting: Nurse Practitioner

## 2024-03-02 ENCOUNTER — Encounter (HOSPITAL_COMMUNITY): Payer: Self-pay

## 2024-03-02 DIAGNOSIS — R072 Precordial pain: Secondary | ICD-10-CM | POA: Diagnosis not present

## 2024-03-02 MED ORDER — NITROGLYCERIN 0.4 MG SL SUBL
0.8000 mg | SUBLINGUAL_TABLET | Freq: Once | SUBLINGUAL | Status: AC
  Administered 2024-03-02: 0.8 mg via SUBLINGUAL

## 2024-03-02 MED ORDER — IOHEXOL 350 MG/ML SOLN
100.0000 mL | Freq: Once | INTRAVENOUS | Status: AC | PRN
  Administered 2024-03-02: 100 mL via INTRAVENOUS

## 2024-03-02 MED ORDER — NITROGLYCERIN 0.4 MG SL SUBL
SUBLINGUAL_TABLET | SUBLINGUAL | Status: AC
  Filled 2024-03-02: qty 2

## 2024-03-03 ENCOUNTER — Encounter (HOSPITAL_BASED_OUTPATIENT_CLINIC_OR_DEPARTMENT_OTHER): Payer: Self-pay

## 2024-05-15 DIAGNOSIS — H25813 Combined forms of age-related cataract, bilateral: Secondary | ICD-10-CM | POA: Diagnosis not present

## 2024-05-15 DIAGNOSIS — H524 Presbyopia: Secondary | ICD-10-CM | POA: Diagnosis not present

## 2024-05-15 DIAGNOSIS — E119 Type 2 diabetes mellitus without complications: Secondary | ICD-10-CM | POA: Diagnosis not present

## 2024-05-15 DIAGNOSIS — H04213 Epiphora due to excess lacrimation, bilateral lacrimal glands: Secondary | ICD-10-CM | POA: Diagnosis not present

## 2024-05-15 DIAGNOSIS — H16223 Keratoconjunctivitis sicca, not specified as Sjogren's, bilateral: Secondary | ICD-10-CM | POA: Diagnosis not present

## 2024-05-15 DIAGNOSIS — H52223 Regular astigmatism, bilateral: Secondary | ICD-10-CM | POA: Diagnosis not present

## 2024-05-17 NOTE — Progress Notes (Signed)
 " Cardiology Office Note:  .   Date:  05/18/2024 ID:  Tracy Reilly, DOB [birth year], MRN [id number] PCP: Tracy Planas, MD [facility] Providers Cardiologist:  None   Patient Profile: .      PMH Coronary artery disease CT Calcium score 07/20/2023 CAC score 410 (99th percentile) LM 6, LAD 273, LCx 60, RCA 71 Coronary CTA 03/02/2024 CAC score 473 (99th percentile) Severe TPV Mild non flow-limiting CAD in proximal, mid and distal LAD 30-49% Minimal calcified plaque in RCA (0-24%) Hepatic steatosis Dyslipidemia Prediabetes Obesity Aortic atherosclerosis Hypertension Flow murmur (thinks echo was in 1997)  Referred to cardiology and seen by me on 02/09/24 as new patient for elevated coronary artery calcium score. Reported occasional chest discomfort and shortness of breath, previously attributed to weight and indigestion. Chest pain sometimes occurs when just sitting, for example she felt this while driving to the beach recently. Shortness of breath occurs more with activity including going up and down her steps doing laundry.  Occasional palpitations not specifically associated with activity.  Mild leg edema, particularly when it is hot outside or she has been standing for long periods. No orthopnea, PND, presyncope, syncope. She has been working on lifestyle modification, including diet and exercise, to manage her prediabetes and weight.  She was started on rosuvastatin October 2024 and is also taking aspirin. Family history of heart problems and strokes, with her father as well as his parents and all of his siblings having heart issues. On amlodipine and lisinopril for blood pressure control. Occasional leg swelling, particularly when outside in hot weather or after standing for long periods.  She works from home and admits it is mostly sedentary, although she does have a stand-up desk to use.  Diet includes frequent restaurant meals including chicken and salads from [employer] and [employer].  ASCVD risk score 4.4%.  Due to concerning symptoms and additional risk factors, we obtained coronary CTA for ischemia evaluation that revealed CAC score 473 (99th percentile), severe TPV, mild, nonflow limiting CAD in prox, mid and distal LAD (30-49%), minimal calcified plaque in RCA 0-24% and mild aortic atherosclerosis.        History of Present Illness: .     Discussed the use of AI scribe software for clinical note transcription with the patient, who gave verbal consent to proceed.  History of Present Illness Tracy Reilly is a very pleasant 58 y.o. female  who is here today for follow-up of CAD. She experiences chest pain throughout her chest, back, and neck, associated with the stress of the death of her mother and work-related stress. She is prediabetic with an A1c last recorded at 6.1%. She struggles with weight management, primarily consuming water and sweet tea, salads, chicken, and shrimp, and finds it challenging to incorporate more vegetables into her diet. She acknowledges the need to increase physical activity, particularly walking, but finds it difficult due to the heat.  She denies chest pain, shortness of breath, orthopnea, PND, palpitations, presyncope, syncope. We had a lengthy discussion about whole food diets versus processed and ultra processed foods.    Lipoprotein (a)  Date/Time Value Ref Range Status  02/17/2024 08:17 AM 9.8 <75.0 nmol/L Final    Comment:    Note:  Values greater than or equal to 75.0 nmol/L may        indicate an independent risk factor for CHD,        but must be evaluated with caution when applied  to non-Caucasian populations due to the        influence of genetic factors on Lp(a) across        ethnicities.       ROS: See HPI       Studies Reviewed: .          Risk Assessment/Calculations:             Physical Exam:   VS: BP 132/70 (BP Location: Right Arm, Patient Position: Sitting, Cuff Size: Normal)   Pulse 66   Ht 5' 6  (1.676 m)   Wt 218 lb (98.9 kg)   SpO2 99%   BMI 35.19 kg/m   Wt Readings from Last 3 Encounters:  05/18/24 218 lb (98.9 kg)  02/09/24 213 lb 9.6 oz (96.9 kg)  08/16/23 198 lb (89.8 kg)     GEN: Obese, well developed in no acute distress NECK: No JVD; No carotid bruits CARDIAC: RRR, no murmurs, rubs, gallops RESPIRATORY:  Clear to auscultation without rales, wheezing or rhonchi  ABDOMEN: Soft, non-tender, non-distended EXTREMITIES:  No edema; No deformity     ASSESSMENT AND PLAN: .    CAD/Cardiac risk: Elevated CAC score > 300 wtih additional risk factors of hypertension, hyperlipidemia, prediabetes, and family history of ASCVD. Coronary CTA revealed nonobstructive CAD.  She is not exercising on a consistent basis, but denies chest pain, shortness of breath, or other symptoms concerning for angina with exertion.  We will continue medical management. We had a lengthy discussion about healthy diet and the importance of lifestyle modification.LDL is well controlled.  -Continue aspirin, rosuvastatin -Focus on secondary prevention including heart healthy mostly plant based diet avoiding saturated fat, processed foods, simple carbohydrates, and sugar along with aiming for at least 150 minutes of moderate intensity exercise each week.   Hypertension: BP is well-controlled on lisinopril and amlodipine.  -No change in anti-hypertensive therapy today.   Weight management: She has struggled for several years with weight loss.  Previously seen at a weight loss clinic and was unable to get Wegovy  approved.  However with diagnosis of CAD and BMI > 30, she should be approved for GLP1 agonist. Lengthy discussion about the importance of healthy diet and regular exercise with the addition of these medications.  -We will start Wegovy  0.25 mg weekly for 4 weeks and then assess for dose up-titration -Encouraged heart healthy mostly plant based diet avoiding saturated fat, processed foods, simple  carbohydrates, and sugar along with aiming for at least 150 minutes of moderate intensity exercise each week.   Hyperlipidemia  LDL goal < 70: Lipid panel  completed 02/17/24 with LDL particle number 523, LDL-C 50, HDL-C 54, triglycerides 74, small LDL-P 286 with improvement in LDL from 136 on 05/31/2023. LP(a) is not elevated.  -Continue rosuvastatin.  Disposition: 3 months with me  Signed, Rosaline Bane, NP-C "

## 2024-05-18 ENCOUNTER — Ambulatory Visit (HOSPITAL_BASED_OUTPATIENT_CLINIC_OR_DEPARTMENT_OTHER): Admitting: Nurse Practitioner

## 2024-05-18 ENCOUNTER — Encounter (HOSPITAL_BASED_OUTPATIENT_CLINIC_OR_DEPARTMENT_OTHER): Payer: Self-pay | Admitting: Nurse Practitioner

## 2024-05-18 VITALS — BP 132/70 | HR 66 | Ht 66.0 in | Wt 218.0 lb

## 2024-05-18 DIAGNOSIS — I10 Essential (primary) hypertension: Secondary | ICD-10-CM

## 2024-05-18 DIAGNOSIS — E66812 Obesity, class 2: Secondary | ICD-10-CM | POA: Diagnosis not present

## 2024-05-18 DIAGNOSIS — Z7189 Other specified counseling: Secondary | ICD-10-CM

## 2024-05-18 DIAGNOSIS — I2510 Atherosclerotic heart disease of native coronary artery without angina pectoris: Secondary | ICD-10-CM

## 2024-05-18 DIAGNOSIS — E785 Hyperlipidemia, unspecified: Secondary | ICD-10-CM

## 2024-05-18 DIAGNOSIS — Z6835 Body mass index (BMI) 35.0-35.9, adult: Secondary | ICD-10-CM

## 2024-05-18 MED ORDER — WEGOVY 0.25 MG/0.5ML ~~LOC~~ SOAJ
0.2500 mg | SUBCUTANEOUS | 0 refills | Status: DC
Start: 2024-05-18 — End: 2024-09-05

## 2024-05-18 NOTE — Patient Instructions (Addendum)
 Medication Instructions:   START Wegovy  inject 0.25 into skin weekly.   *If you need a refill on your cardiac medications before your next appointment, please call your pharmacy*  Lab Work:  None ordered.  If you have labs (blood work) drawn today and your tests are completely normal, you will receive your results only by: MyChart Message (if you have MyChart) OR A paper copy in the mail If you have any lab test that is abnormal or we need to change your treatment, we will call you to review the results.  Testing/Procedures:   Follow-Up: At [facility], you and your health needs are our priority.  As part of our continuing mission to provide you with exceptional heart care, our providers are all part of one team.  This team includes your primary Cardiologist (physician) and Advanced Practice Providers or APPs (Physician Assistants and Nurse Practitioners) who all work together to provide you with the care you need, when you need it.  Your next appointment:   4 month(s)  Provider:   Rosaline Bane, NP    We recommend signing up for the patient portal called MyChart.  Sign up information is provided on this After Visit Summary.  MyChart is used to connect with patients for Virtual Visits (Telemedicine).  Patients are able to view lab/test results, encounter notes, upcoming appointments, etc.  Non-urgent messages can be sent to your provider as well.   To learn more about what you can do with MyChart, go to [email or web address].   Other Instructions     the incidences of MACE (major adverse cardiac event) and cancer were higher with the combined tofacitinib doses (3.4% [98 patients] and 4.2% [122 patients], respectively) than with a TNF inhibitor (Embrel, Humira) (2.5% [37 patients] and 2.9% [42 patients]). The hazard ratios were 1.33 (95% confidence interval [CI], 0.91 to 1.94) for MACE  So Rinvoq caused slightly higher incidence of major adverse cardiac  event Recommendation from cardiology pharmacy was if you are stable on the medication, risk is not significant

## 2024-05-19 ENCOUNTER — Other Ambulatory Visit (HOSPITAL_COMMUNITY): Payer: Self-pay

## 2024-05-19 ENCOUNTER — Telehealth: Payer: Self-pay

## 2024-05-19 NOTE — Telephone Encounter (Signed)
 Pharmacy Patient Advocate Encounter   Received notification from Physician's Office that prior authorization for WEGOVY  is required/requested.   Insurance verification completed.   The patient is insured through [facility] .   Per test claim: PRODUCT/SERVICE NOT COVERED. PLAN/BENEFIT EXCLUSION.

## 2024-05-19 NOTE — Telephone Encounter (Signed)
-----   Message from Percy Rosaline HERO sent at 05/18/2024  4:03 PM EDT ----- Would you please do PA for Wegovy . She has CAD, BMI > 27 with comorbidity and has failed to lose 5% of body weight on lifestyle modification alone.  Thank you, Rosaline

## 2024-05-20 ENCOUNTER — Encounter (HOSPITAL_BASED_OUTPATIENT_CLINIC_OR_DEPARTMENT_OTHER): Payer: Self-pay

## 2024-06-08 DIAGNOSIS — L2084 Intrinsic (allergic) eczema: Secondary | ICD-10-CM | POA: Diagnosis not present

## 2024-06-08 DIAGNOSIS — Z79899 Other long term (current) drug therapy: Secondary | ICD-10-CM | POA: Diagnosis not present

## 2024-06-08 LAB — LAB REPORT - SCANNED: EGFR: 89

## 2024-07-04 DIAGNOSIS — N3090 Cystitis, unspecified without hematuria: Secondary | ICD-10-CM | POA: Diagnosis not present

## 2024-07-04 DIAGNOSIS — R300 Dysuria: Secondary | ICD-10-CM | POA: Diagnosis not present

## 2024-07-04 DIAGNOSIS — Z6835 Body mass index (BMI) 35.0-35.9, adult: Secondary | ICD-10-CM | POA: Diagnosis not present

## 2024-09-04 ENCOUNTER — Encounter (HOSPITAL_BASED_OUTPATIENT_CLINIC_OR_DEPARTMENT_OTHER): Payer: Self-pay

## 2024-09-04 NOTE — Progress Notes (Signed)
 Cardiology Office Note:  .   Date:  09/13/2024 ID:  Sharlet DELENA Mania, DOB 07-21-1966, MRN [id number] PCP: Cleotilde Planas, MD [facility] Providers Cardiologist:  None   Patient Profile: .      PMH Coronary artery disease CT Calcium score 07/20/2023 CAC score 410 (99th percentile) LM 6, LAD 273, LCx 60, RCA 71 Coronary CTA 03/02/2024 CAC score 473 (99th percentile) Severe TPV Mild non flow-limiting CAD in proximal, mid and distal LAD 30-49% Minimal calcified plaque in RCA (0-24%) Hepatic steatosis Dyslipidemia Prediabetes Obesity Aortic atherosclerosis Hypertension Flow murmur (thinks echo was in 1997)  Referred to cardiology and seen by me on 02/09/24 as new patient for elevated coronary artery calcium score. Reported occasional chest discomfort and shortness of breath, previously attributed to weight and indigestion. Chest pain sometimes occurs when just sitting, for example she felt this while driving to the beach recently. Shortness of breath occurs more with activity including going up and down her steps doing laundry.  Occasional palpitations not specifically associated with activity.  Mild leg edema, particularly when it is hot outside or she has been standing for long periods. No orthopnea, PND, presyncope, syncope. She has been working on lifestyle modification, including diet and exercise, to manage her prediabetes and weight.  She was started on rosuvastatin October 2024 and is also taking aspirin. Family history of heart problems and strokes, with her father as well as his parents and all of his siblings having heart issues. On amlodipine and lisinopril for blood pressure control. Occasional leg swelling, particularly when outside in hot weather or after standing for long periods.  She works from home and admits it is mostly sedentary, although she does have a stand-up desk to use.  Diet includes frequent restaurant meals including chicken and salads from [employer] and  [employer]. ASCVD risk score 4.4%.  Due to concerning symptoms and additional risk factors, we obtained coronary CTA for ischemia evaluation that revealed CAC score 473 (99th percentile), severe TPV, mild, nonflow limiting CAD in prox, mid and distal LAD (30-49%), minimal calcified plaque in RCA 0-24% and mild aortic atherosclerosis.   Seen by me on 05/18/24 for follow-up of CAD. Notes chest pain throughout her chest, back, and neck, associated with the stress of the death of her mother and work-related stress. She is prediabetic with an A1c last recorded at 6.1%. She struggles with weight management, primarily consuming water and sweet tea, salads, chicken, and shrimp, and finds it challenging to incorporate more vegetables into her diet. Acknowledges the need to increase physical activity, particularly walking, but finds it difficult due to the heat. No chest pain, shortness of breath, orthopnea, PND, palpitations, presyncope, or syncope. We had a lengthy discussion about whole food diets versus processed and ultra processed foods. Lipid panel  completed 02/17/24 with LDL particle number 523, LDL-C 50, HDL-C 54, triglycerides 74, small LDL-P 286 with improvement in LDL from 136 on 05/31/2023. LP(a) is not elevated. She was interested in starting GLP1 for weight loss but unfortunately it was cost-prohibitive.        History of Present Illness: .     Discussed the use of AI scribe software for clinical note transcription with the patient, who gave verbal consent to proceed.  History of Present Illness Tracy Reilly is a very pleasant 58 y.o. female  who is here today for follow-up of dyslipidemia.  She experiences persistent shortness of breath during strenuous activities such as climbing stairs or doing household chores but no  chest pain, n/v, or diaphoresis. She feels this is secondary to deconditioning given that she does not experience SOB during normal-paced walking. No palpitations, chest pain,  orthopnea, PND, edema, presyncope or syncope. LDL cholesterol has decreased from 136 to 50 since starting rosuvastatin; there are no concerning side effects. Admits she has not been active as she would like to be.  She has not walk for exercise in about a month.  Acknowledges the need and desire to modify lifestyle and achieve weight loss but has some barriers to doing so currently.  BP is well-controlled.   Lipoprotein (a)  Date/Time Value Ref Range Status  02/17/2024 08:17 AM 9.8 <75.0 nmol/L Final    Comment:    Note:  Values greater than or equal to 75.0 nmol/L may        indicate an independent risk factor for CHD,        but must be evaluated with caution when applied        to non-Caucasian populations due to the        influence of genetic factors on Lp(a) across        ethnicities.       ROS: See HPI       Studies Reviewed: .          Risk Assessment/Calculations:           Physical Exam:   VS: BP 128/70 (BP Location: Left Arm, Patient Position: Sitting, Cuff Size: Large)   Pulse 93   Ht 5' 6.5 (1.689 m)   Wt 222 lb 8 oz (100.9 kg)   SpO2 98%   BMI 35.37 kg/m   Wt Readings from Last 3 Encounters:  09/11/24 222 lb 8 oz (100.9 kg)  05/18/24 218 lb (98.9 kg)  02/09/24 213 lb 9.6 oz (96.9 kg)     GEN: Obese, well developed in no acute distress NECK: No JVD; No carotid bruits CARDIAC: RRR, no murmurs, rubs, gallops RESPIRATORY:  Clear to auscultation without rales, wheezing or rhonchi  ABDOMEN: Soft, non-tender, non-distended EXTREMITIES:  No edema; No deformity     ASSESSMENT AND PLAN: .    CAD Cardiac risk Aortic atherosclerosis DOE Elevated CAC score > 300 wtih additional risk factors of hypertension, hyperlipidemia, prediabetes, and family history of ASCVD. Coronary CTA revealed nonobstructive CAD. Notes DOE with moderate to intense activity which she attributes to deconditioning. No chest pain, orthopnea, PND, or edema. Has walked at times for exercise  but not consistently. She is able to complete a walk when pursued, but notes DOE at the top of the steps when doing laundry. No indication for further ischemia evaluation at this time. We will continue medical management. We had a lengthy discussion about healthy diet and the importance of lifestyle modification and she acknowledges that she needs to be more deliberate but has some life stressors that are interfering. We discussed intentional goal setting. LDL is well controlled.  -Continue aspirin, rosuvastatin -Focus on secondary prevention including heart healthy mostly plant based diet avoiding saturated fat, processed foods, simple carbohydrates, and sugar  - Aim to increase regular physical activity as well as at least 150 minutes of moderate intensity exercise each week  Hypertension BP is well-controlled on lisinopril and amlodipine. Renal function stable on recent lab testing with other providers.  -No change in anti-hypertensive therapy today - Continue lisinopril and amlodipine  Weight management Has struggled for several years with weight management.  We have been unable to get approval for GLP-1  despite diagnosis of CAD and BMI > 30. Lengthy discussion about the importance of healthy diet and regular exercise for weight loss and improved health.  -Encouraged heart healthy mostly plant based diet avoiding saturated fat, processed foods, simple carbohydrates, and sugar along with aiming for at least 150 minutes of moderate intensity exercise each week  Hyperlipidemia  LDL goal < 70 Lipid panel completed 06/08/2024 by dermatologist with total cholesterol 155, triglycerides 98, HDL 75, and LDL 62. LP(a) is not elevated. No concerning side effects on rosuvastatin. -Continue rosuvastatin.  Disposition: 1 year with me  Signed, Rosaline Bane, NP-C

## 2024-09-08 ENCOUNTER — Encounter (HOSPITAL_BASED_OUTPATIENT_CLINIC_OR_DEPARTMENT_OTHER): Payer: Self-pay

## 2024-09-11 ENCOUNTER — Ambulatory Visit (HOSPITAL_BASED_OUTPATIENT_CLINIC_OR_DEPARTMENT_OTHER): Admitting: Nurse Practitioner

## 2024-09-11 ENCOUNTER — Other Ambulatory Visit (HOSPITAL_BASED_OUTPATIENT_CLINIC_OR_DEPARTMENT_OTHER): Payer: Self-pay

## 2024-09-11 ENCOUNTER — Encounter (HOSPITAL_BASED_OUTPATIENT_CLINIC_OR_DEPARTMENT_OTHER): Payer: Self-pay | Admitting: Nurse Practitioner

## 2024-09-11 VITALS — BP 128/70 | HR 93 | Ht 66.5 in | Wt 222.5 lb

## 2024-09-11 DIAGNOSIS — Z789 Other specified health status: Secondary | ICD-10-CM

## 2024-09-11 DIAGNOSIS — R0609 Other forms of dyspnea: Secondary | ICD-10-CM

## 2024-09-11 DIAGNOSIS — I700 Atherosclerosis of aorta: Secondary | ICD-10-CM

## 2024-09-11 DIAGNOSIS — Z7189 Other specified counseling: Secondary | ICD-10-CM | POA: Diagnosis not present

## 2024-09-11 DIAGNOSIS — E785 Hyperlipidemia, unspecified: Secondary | ICD-10-CM

## 2024-09-11 DIAGNOSIS — I10 Essential (primary) hypertension: Secondary | ICD-10-CM

## 2024-09-11 DIAGNOSIS — I2510 Atherosclerotic heart disease of native coronary artery without angina pectoris: Secondary | ICD-10-CM

## 2024-09-11 MED ORDER — FLUZONE 0.5 ML IM SUSY
0.5000 mL | PREFILLED_SYRINGE | Freq: Once | INTRAMUSCULAR | 0 refills | Status: AC
  Filled 2024-09-11: qty 0.5, 1d supply, fill #0

## 2024-09-11 NOTE — Patient Instructions (Signed)
 Medication Instructions:   Your physician recommends that you continue on your current medications as directed. Please refer to the Current Medication list given to you today.   *If you need a refill on your cardiac medications before your next appointment, please call your pharmacy*  Lab Work:  None ordered.  If you have labs (blood work) drawn today and your tests are completely normal, you will receive your results only by: MyChart Message (if you have MyChart) OR A paper copy in the mail If you have any lab test that is abnormal or we need to change your treatment, we will call you to review the results.  Testing/Procedures:  Your physician has requested that you have an echocardiogram. Echocardiography is a painless test that uses sound waves to create images of your heart. It provides your doctor with information about the size and shape of your heart and how well your heart's chambers and valves are working. This procedure takes approximately one hour. There are no restrictions for this procedure. Please do NOT wear cologne, perfume or lotions (deodorant is allowed). Please arrive 15 minutes prior to your appointment time.   Follow-Up: At [facility], you and your health needs are our priority.  As part of our continuing mission to provide you with exceptional heart care, our providers are all part of one team.  This team includes your primary Cardiologist (physician) and Advanced Practice Providers or APPs (Physician Assistants and Nurse Practitioners) who all work together to provide you with the care you need, when you need it.  Your next appointment:   1 year(s)  Provider:   Rosaline Bane, NP    We recommend signing up for the patient portal called MyChart.  Sign up information is provided on this After Visit Summary.  MyChart is used to connect with patients for Virtual Visits (Telemedicine).  Patients are able to view lab/test results, encounter notes,  upcoming appointments, etc.  Non-urgent messages can be sent to your provider as well.   To learn more about what you can do with MyChart, go to [email or web address].   Other Instructions  Your physician wants you to follow-up in: 1 year.  You will receive a reminder letter in the mail two months in advance. If you don't receive a letter, please call our office to schedule the follow-up appointment.

## 2024-09-13 ENCOUNTER — Encounter (HOSPITAL_BASED_OUTPATIENT_CLINIC_OR_DEPARTMENT_OTHER): Payer: Self-pay | Admitting: Nurse Practitioner

## 2024-10-10 ENCOUNTER — Other Ambulatory Visit (INDEPENDENT_AMBULATORY_CARE_PROVIDER_SITE_OTHER)

## 2024-10-10 ENCOUNTER — Ambulatory Visit (HOSPITAL_BASED_OUTPATIENT_CLINIC_OR_DEPARTMENT_OTHER): Payer: Self-pay | Admitting: Nurse Practitioner

## 2024-10-10 DIAGNOSIS — Z7189 Other specified counseling: Secondary | ICD-10-CM

## 2024-10-10 DIAGNOSIS — E785 Hyperlipidemia, unspecified: Secondary | ICD-10-CM | POA: Diagnosis not present

## 2024-10-10 DIAGNOSIS — I10 Essential (primary) hypertension: Secondary | ICD-10-CM | POA: Diagnosis not present

## 2024-10-10 DIAGNOSIS — R0609 Other forms of dyspnea: Secondary | ICD-10-CM

## 2024-10-10 DIAGNOSIS — I2510 Atherosclerotic heart disease of native coronary artery without angina pectoris: Secondary | ICD-10-CM

## 2024-10-10 DIAGNOSIS — I700 Atherosclerosis of aorta: Secondary | ICD-10-CM

## 2024-10-10 LAB — ECHOCARDIOGRAM COMPLETE
Area-P 1/2: 5.02 cm2
S' Lateral: 1.79 cm

## 2024-10-16 DIAGNOSIS — Z6835 Body mass index (BMI) 35.0-35.9, adult: Secondary | ICD-10-CM | POA: Diagnosis not present

## 2024-10-16 DIAGNOSIS — Z1231 Encounter for screening mammogram for malignant neoplasm of breast: Secondary | ICD-10-CM | POA: Diagnosis not present

## 2024-10-16 DIAGNOSIS — Z13820 Encounter for screening for osteoporosis: Secondary | ICD-10-CM | POA: Diagnosis not present

## 2024-10-16 DIAGNOSIS — Z01419 Encounter for gynecological examination (general) (routine) without abnormal findings: Secondary | ICD-10-CM | POA: Diagnosis not present

## 2024-10-23 ENCOUNTER — Other Ambulatory Visit: Payer: Self-pay | Admitting: Obstetrics & Gynecology

## 2024-10-23 DIAGNOSIS — R928 Other abnormal and inconclusive findings on diagnostic imaging of breast: Secondary | ICD-10-CM

## 2024-11-06 ENCOUNTER — Encounter (HOSPITAL_BASED_OUTPATIENT_CLINIC_OR_DEPARTMENT_OTHER): Payer: Self-pay

## 2024-11-10 ENCOUNTER — Other Ambulatory Visit: Payer: Self-pay | Admitting: Obstetrics & Gynecology

## 2024-11-10 ENCOUNTER — Ambulatory Visit
Admission: RE | Admit: 2024-11-10 | Discharge: 2024-11-10 | Disposition: A | Source: Ambulatory Visit | Attending: Obstetrics & Gynecology | Admitting: Obstetrics & Gynecology

## 2024-11-10 DIAGNOSIS — R928 Other abnormal and inconclusive findings on diagnostic imaging of breast: Secondary | ICD-10-CM

## 2024-11-10 DIAGNOSIS — N6310 Unspecified lump in the right breast, unspecified quadrant: Secondary | ICD-10-CM

## 2024-11-16 ENCOUNTER — Ambulatory Visit
Admission: RE | Admit: 2024-11-16 | Discharge: 2024-11-16 | Disposition: A | Source: Ambulatory Visit | Attending: Obstetrics & Gynecology | Admitting: Obstetrics & Gynecology

## 2024-11-16 ENCOUNTER — Inpatient Hospital Stay
Admission: RE | Admit: 2024-11-16 | Discharge: 2024-11-16 | Attending: Obstetrics & Gynecology | Admitting: Obstetrics & Gynecology

## 2024-11-16 DIAGNOSIS — N6310 Unspecified lump in the right breast, unspecified quadrant: Secondary | ICD-10-CM

## 2024-11-16 HISTORY — PX: BREAST BIOPSY: SHX20

## 2024-11-17 LAB — SURGICAL PATHOLOGY
